# Patient Record
Sex: Female | Born: 1950 | Race: Black or African American | Hispanic: No | Marital: Married | Smoking: Never smoker
Health system: Southern US, Community
[De-identification: ages and names within clinical notes are randomized; demographics above are authoritative.]

## PROBLEM LIST (undated history)

## (undated) DIAGNOSIS — N200 Calculus of kidney: Secondary | ICD-10-CM

## (undated) DIAGNOSIS — Z9889 Other specified postprocedural states: Secondary | ICD-10-CM

## (undated) DIAGNOSIS — R200 Anesthesia of skin: Secondary | ICD-10-CM

## (undated) DIAGNOSIS — R112 Nausea with vomiting, unspecified: Secondary | ICD-10-CM

## (undated) DIAGNOSIS — S82891A Other fracture of right lower leg, initial encounter for closed fracture: Secondary | ICD-10-CM

## (undated) DIAGNOSIS — T4145XA Adverse effect of unspecified anesthetic, initial encounter: Secondary | ICD-10-CM

## (undated) DIAGNOSIS — N289 Disorder of kidney and ureter, unspecified: Secondary | ICD-10-CM

## (undated) DIAGNOSIS — R202 Paresthesia of skin: Secondary | ICD-10-CM

## (undated) DIAGNOSIS — N390 Urinary tract infection, site not specified: Secondary | ICD-10-CM

## (undated) DIAGNOSIS — I10 Essential (primary) hypertension: Secondary | ICD-10-CM

## (undated) DIAGNOSIS — T8859XA Other complications of anesthesia, initial encounter: Secondary | ICD-10-CM

## (undated) HISTORY — PX: LITHOTRIPSY: SUR834

## (undated) HISTORY — PX: ABDOMINAL HYSTERECTOMY: SHX81

## (undated) HISTORY — PX: OTHER SURGICAL HISTORY: SHX169

---

## 1999-01-02 ENCOUNTER — Other Ambulatory Visit: Admission: RE | Admit: 1999-01-02 | Discharge: 1999-01-02 | Payer: Self-pay | Admitting: Obstetrics and Gynecology

## 1999-05-07 ENCOUNTER — Encounter: Payer: Self-pay | Admitting: Emergency Medicine

## 1999-05-07 ENCOUNTER — Inpatient Hospital Stay (HOSPITAL_COMMUNITY): Admission: EM | Admit: 1999-05-07 | Discharge: 1999-05-09 | Payer: Self-pay | Admitting: Emergency Medicine

## 1999-05-08 ENCOUNTER — Encounter: Payer: Self-pay | Admitting: Urology

## 1999-09-27 ENCOUNTER — Encounter: Payer: Self-pay | Admitting: Urology

## 1999-09-27 ENCOUNTER — Encounter: Admission: RE | Admit: 1999-09-27 | Discharge: 1999-09-27 | Payer: Self-pay | Admitting: Urology

## 1999-10-11 ENCOUNTER — Ambulatory Visit (HOSPITAL_COMMUNITY): Admission: RE | Admit: 1999-10-11 | Discharge: 1999-10-11 | Payer: Self-pay | Admitting: Urology

## 1999-10-11 ENCOUNTER — Encounter: Payer: Self-pay | Admitting: Urology

## 1999-11-06 ENCOUNTER — Encounter: Admission: RE | Admit: 1999-11-06 | Discharge: 1999-11-06 | Payer: Self-pay | Admitting: Urology

## 1999-11-06 ENCOUNTER — Encounter: Payer: Self-pay | Admitting: Urology

## 1999-11-12 ENCOUNTER — Ambulatory Visit (HOSPITAL_COMMUNITY): Admission: RE | Admit: 1999-11-12 | Discharge: 1999-11-12 | Payer: Self-pay | Admitting: Urology

## 1999-11-19 ENCOUNTER — Encounter: Payer: Self-pay | Admitting: Urology

## 1999-11-19 ENCOUNTER — Ambulatory Visit (HOSPITAL_COMMUNITY): Admission: RE | Admit: 1999-11-19 | Discharge: 1999-11-19 | Payer: Self-pay | Admitting: Urology

## 1999-11-26 ENCOUNTER — Encounter: Admission: RE | Admit: 1999-11-26 | Discharge: 1999-11-26 | Payer: Self-pay | Admitting: Urology

## 1999-11-26 ENCOUNTER — Encounter: Payer: Self-pay | Admitting: Urology

## 2000-01-01 ENCOUNTER — Encounter: Payer: Self-pay | Admitting: Urology

## 2000-01-01 ENCOUNTER — Encounter: Admission: RE | Admit: 2000-01-01 | Discharge: 2000-01-01 | Payer: Self-pay | Admitting: Urology

## 2000-07-24 ENCOUNTER — Other Ambulatory Visit: Admission: RE | Admit: 2000-07-24 | Discharge: 2000-07-24 | Payer: Self-pay | Admitting: Obstetrics and Gynecology

## 2000-08-07 ENCOUNTER — Encounter: Payer: Self-pay | Admitting: Urology

## 2000-08-07 ENCOUNTER — Encounter: Admission: RE | Admit: 2000-08-07 | Discharge: 2000-08-07 | Payer: Self-pay | Admitting: Urology

## 2001-03-11 ENCOUNTER — Encounter: Payer: Self-pay | Admitting: Urology

## 2001-03-11 ENCOUNTER — Encounter: Admission: RE | Admit: 2001-03-11 | Discharge: 2001-03-11 | Payer: Self-pay | Admitting: Urology

## 2002-12-27 ENCOUNTER — Other Ambulatory Visit: Admission: RE | Admit: 2002-12-27 | Discharge: 2002-12-27 | Payer: Self-pay | Admitting: Obstetrics and Gynecology

## 2003-03-08 ENCOUNTER — Ambulatory Visit (HOSPITAL_COMMUNITY): Admission: RE | Admit: 2003-03-08 | Discharge: 2003-03-08 | Payer: Self-pay | Admitting: Gastroenterology

## 2004-11-19 ENCOUNTER — Emergency Department (HOSPITAL_COMMUNITY): Admission: EM | Admit: 2004-11-19 | Discharge: 2004-11-19 | Payer: Self-pay | Admitting: Emergency Medicine

## 2006-05-18 ENCOUNTER — Emergency Department (HOSPITAL_COMMUNITY): Admission: EM | Admit: 2006-05-18 | Discharge: 2006-05-18 | Payer: Self-pay | Admitting: Emergency Medicine

## 2006-05-22 ENCOUNTER — Ambulatory Visit (HOSPITAL_BASED_OUTPATIENT_CLINIC_OR_DEPARTMENT_OTHER): Admission: RE | Admit: 2006-05-22 | Discharge: 2006-05-22 | Payer: Self-pay | Admitting: Urology

## 2006-08-04 ENCOUNTER — Other Ambulatory Visit: Admission: RE | Admit: 2006-08-04 | Discharge: 2006-08-04 | Payer: Self-pay | Admitting: Obstetrics and Gynecology

## 2006-08-13 ENCOUNTER — Encounter: Admission: RE | Admit: 2006-08-13 | Discharge: 2006-08-13 | Payer: Self-pay | Admitting: Internal Medicine

## 2007-03-09 ENCOUNTER — Encounter: Admission: RE | Admit: 2007-03-09 | Discharge: 2007-03-09 | Payer: Self-pay | Admitting: Internal Medicine

## 2010-02-18 ENCOUNTER — Encounter: Payer: Self-pay | Admitting: Internal Medicine

## 2010-06-15 NOTE — Op Note (Signed)
NAMEMELI, Emily Raymond               ACCOUNT NO.:  [id number]   MEDICAL RECORD NO.:  [id number]          PATIENT TYPE:  AMB   LOCATION:  [facility]                         FACILITY:  [facility]   PHYSICIAN:  Jamison Neighbor, M.D.  DATE OF BIRTH:  [birth year]   DATE OF PROCEDURE:  DATE OF DISCHARGE:                               OPERATIVE REPORT   PREOPERATIVE DIAGNOSES:  1. Left UVJ stone.  2. Left lower pole calculi.   POSTOPERATIVE DIAGNOSES:  1. Passed left ureteral calculus.  2. Distal left ureteral stricture.  3. Left lower pole calculi.   PROCEDURE:  1. Cystoscopy, left retrograde with interpretation.  2. Left ureteral dilation.  3. Left ureteroscopy.  4. Left double J catheter insertion.   SURGEON:  Evans.   ANESTHESIA:  General.   COMPLICATIONS:  None.   DRAINS:  6 French x 24 cm double J catheter.   BRIEF HISTORY:  This 60 year old female has a history of renal and  ureteral calculi.  She has had stone extraction before as well as past  ESWL.  The patient recently developed pain on the left, pain on the  side.  A CT scan showed a stone in the distal left ureter, measuring 4  mm in size.  The patient also had 2 smaller stones in the left kidney  and there was associated hydronephrosis.  The patient was given Flomax  as well as pain medication but she has to pass the stone.  She is now to  undergo cystoscopy retrograde studies, ureteroscopy and possible basket  extraction.  She understands the risks and benefits of the procedure and  gave full informed consent.   PROCEDURE:  After successful induction under general anesthesia the  patient was placed in the dorsal lithotomy position, prepped with  Betadine, draped in the usual sterile fashion.  Careful bimanual  examination revealed a normal pelvis with no significant cystocele,  rectocele or enterocele and no mass on bimanual exam.  The cystoscope  was inserted.  The bladder was carefully inspected.  No tumors or stones  could be seen. The two ureterorrhaphies were normal in configuration and  location.  The left ureteral orifice was cannulated with a 6 French  ureteral cath and a retrograde study was performed.   Retrograde pyelography was performed by injecting 2 of their previously  placed ureteral catheter.  Hydronephrosis was identified but no real  filling defect could be identified in the distal left ureter.  The  kidney filled normally. It was a question of two possible calculi in the  lower pole but there were somewhat indistinct on the retrograde study.  No tumors could be identified and there was no regularity of the  collecting system.   After completion of the retrograde study a guidewire was passed up to  the kidney and allowed to flow normally within the collecting system.  The  cystoscope was removed and the guidewire was left in place. The  ureteroscope was inserted but could not be inserted into the ureter due  to stricturing.  Diuresing re-relaxed.  The sheath was used to dilate  the  stricture.  After completion of the dilation, ureteroscope could be  advanced.  Once the intermural tunnel had been bypassed, the ureter was  wide open. The scope was advanced all the way to the kidney.  The  collecting system in the upper pole could be easily identified but due  to the rigid nature of the scope, the lower pole was not visualized.  It  was not felt that the patient required extra action of those two lower  pole calculi which should pass on their own if they should ever start to  move.  The scope was withdrawn and the entire ureter was identified.  The strictured area was noted to be wide open.  There were no stones  anywhere and really no evidence of calcification in the intramural  tissue.  The stone apparently had passed but left a strictured area that  was causing obstruction and pain.  The wire was left in place.  Ureteroscope was completely withdrawn.  The double J cath was then   backloaded over the wire and allowed to pass up into the kidney.  Position was then set so the that the coil in the bladder was normal.  The wire was removed.  The string was taped to the patient's thigh.   DISCHARGE INSTRUCTIONS:  The patient will be sent home with Macrodantin  1 daily until the stent is removed.  Pyridium plus as needed for any  discomfort.  She still has pain medication.   FOLLOW UP:  She can return to see me in followup in several weeks.           ______________________________  Jamison Neighbor, M.D.  Electronically Signed     RJE/MEDQ  D:  05/22/2006  T:  05/22/2006  Job:  [id number]

## 2010-06-15 NOTE — Op Note (Signed)
[facility]  Patient:    Emily Raymond, Emily Raymond                      MRN: [id number] Proc. Date: 11/12/99 Adm. Date:  [id number] Attending:  Laqueta Jean                           Operative Report  PREOPERATIVE DIAGNOSIS:  Left renal stone.  POSTOPERATIVE DIAGNOSIS:  Left renal stone.  PROCEDURE:  Cystourethroscopy, left retrograde pyelogram, left double J catheter.  SURGEON:  Sigmund I. Patsi Sears, M.D.  ANESTHESIA:  General (LMA).  PREOPERATIVE PREPARATION:  Following appropriate preanesthesia, the patient was brought into the operating room and placed on the operating table in the dorsal supine position, where general anesthesia was introduced (LMA).  She was then replaced in the dorsal lithotomy position, where the pubis was prepped with Betadine solution in the usual fashion.  DESCRIPTION OF PROCEDURE:  Cystourethroscopy was accomplished after reviewing the KUB, which shows that the patient has left renal stone material present. She is having continued left flank pain.  By past history, it is noted that the patient is status post cystoscopy with left double J catheter placed in April 2001 for uroseptic shock secondary to obstructing stone.  She is status post basket extraction of left lower ureteral calculus in April 2001, with a diagnosis of calcium phosphate stone.  Cystoscopy was accomplished today, which showed normal-appearing bladder with a small left ureteral orifice.  Left retrograde pyelogram revealed no evidence of a ureteral calculus.  There was no dilation of the ureter.  The KUB showed the stone in the left lower pole, but the stone could not be easily identified on retrograde.  Double J catheter was placed in the upper pole, and it coiled in the bladder easily.  The patient will be left on antibiotics, and awakened, taken to the recovery room after IV Toradol given. DD:  11/12/99 TD:  11/12/99 Job: [id number]

## 2010-06-15 NOTE — Op Note (Signed)
NAME:  Emily Raymond, BORAWSKI                         ACCOUNT NO.:  [id number]   MEDICAL RECORD NO.:  [id number]                   PATIENT TYPE:  AMB   LOCATION:  ENDO                                 FACILITY:  [facility]   PHYSICIAN:  James L. Malon Kindle., M.D.          DATE OF BIRTH:  Apr 22, 1950   DATE OF PROCEDURE:  03/08/2003  DATE OF DISCHARGE:                                 OPERATIVE REPORT   PROCEDURE:  Colonoscopy.   MEDICATIONS:  Fentanyl 75 mcg, Versed 8 mg IV.   INDICATIONS:  Colon cancer screening.   DESCRIPTION OF PROCEDURE:  The procedure had been explained to the patient  and consent obtained.   With the patient in the left lateral decubitus position, the Olympus  pediatric adjustable coloscope was inserted and advanced under direct  visualization.  A digital exam had been performed.  The prep was excellent.  We advanced easily to the cecum.  The ileocecal valve and appendiceal  orifice were seen.  The  scope was withdrawn and the cecum, ascending colon,  transverse colon, splenic flexure, descending and sigmoid colon were seen  well.  No polyps or other lesions were seen.  No significant diverticular  disease.  The rectum was free of polyps.  The scope was withdrawn.  The  patient tolerated the procedure well.   ASSESSMENT:  Normal screening colonoscopy.   PLAN:  Will recommend yearly hemoccults and consider another exam in 10  years.                                               James L. Malon Kindle., M.D.    Waldron Session  D:  03/08/2003  T:  03/08/2003  Job:  [id number]   cc:   Theressa Millard, M.D.  [address] [place]  [place] [address]  Fax: [phone]

## 2014-07-26 ENCOUNTER — Encounter (HOSPITAL_BASED_OUTPATIENT_CLINIC_OR_DEPARTMENT_OTHER): Payer: Self-pay | Admitting: Emergency Medicine

## 2014-07-26 ENCOUNTER — Emergency Department (HOSPITAL_BASED_OUTPATIENT_CLINIC_OR_DEPARTMENT_OTHER)
Admission: EM | Admit: 2014-07-26 | Discharge: 2014-07-26 | Disposition: A | Discharge status: Home / Self Care | Payer: 59 | Attending: Emergency Medicine | Admitting: Emergency Medicine

## 2014-07-26 ENCOUNTER — Emergency Department (HOSPITAL_BASED_OUTPATIENT_CLINIC_OR_DEPARTMENT_OTHER): Payer: 59

## 2014-07-26 DIAGNOSIS — S82851A Displaced trimalleolar fracture of right lower leg, initial encounter for closed fracture: Secondary | ICD-10-CM | POA: Diagnosis not present

## 2014-07-26 DIAGNOSIS — W108XXA Fall (on) (from) other stairs and steps, initial encounter: Secondary | ICD-10-CM | POA: Diagnosis not present

## 2014-07-26 DIAGNOSIS — Z79899 Other long term (current) drug therapy: Secondary | ICD-10-CM | POA: Insufficient documentation

## 2014-07-26 DIAGNOSIS — Y9289 Other specified places as the place of occurrence of the external cause: Secondary | ICD-10-CM | POA: Insufficient documentation

## 2014-07-26 DIAGNOSIS — Z8742 Personal history of other diseases of the female genital tract: Secondary | ICD-10-CM | POA: Insufficient documentation

## 2014-07-26 DIAGNOSIS — I10 Essential (primary) hypertension: Secondary | ICD-10-CM | POA: Diagnosis not present

## 2014-07-26 DIAGNOSIS — Y9389 Activity, other specified: Secondary | ICD-10-CM | POA: Diagnosis not present

## 2014-07-26 DIAGNOSIS — Y998 Other external cause status: Secondary | ICD-10-CM | POA: Diagnosis not present

## 2014-07-26 DIAGNOSIS — W19XXXA Unspecified fall, initial encounter: Secondary | ICD-10-CM

## 2014-07-26 DIAGNOSIS — S99911A Unspecified injury of right ankle, initial encounter: Secondary | ICD-10-CM | POA: Diagnosis present

## 2014-07-26 HISTORY — DX: Essential (primary) hypertension: I10

## 2014-07-26 HISTORY — DX: Disorder of kidney and ureter, unspecified: N28.9

## 2014-07-26 MED ORDER — OXYCODONE-ACETAMINOPHEN 5-325 MG PO TABS
2.0000 | ORAL_TABLET | Freq: Once | ORAL | Status: AC
  Administered 2014-07-26: 2 via ORAL
  Filled 2014-07-26: qty 2

## 2014-07-26 MED ORDER — OXYCODONE-ACETAMINOPHEN 5-325 MG PO TABS: 1.0000 | ORAL_TABLET | Freq: Three times a day (TID) | ORAL | Status: DC | PRN

## 2014-07-26 NOTE — Discharge Instructions (Signed)
Ankle Fracture  A fracture is a break in a bone. The ankle joint is made up of three bones. These include the lower (distal)sections of your lower leg bones, called the tibia and fibula, along with a bone in your foot, called the talus. Depending on how bad the break is and if more than one ankle joint bone is broken, a cast or splint is used to protect and keep your injured bone from moving while it heals. Sometimes, surgery is required to help the fracture heal properly.   There are two general types of fractures:   Stable fracture. This includes a single fracture line through one bone, with no injury to ankle ligaments. A fracture of the talus that does not have any displacement (movement of the bone on either side of the fracture line) is also stable.   Unstable fracture. This includes more than one fracture line through one or more bones in the ankle joint. It also includes fractures that have displacement of the bone on either side of the fracture line.  CAUSES   A direct blow to the ankle.    Quickly and severely twisting your ankle.   Trauma, such as a car accident or falling from a significant height.  RISK FACTORS  You may be at a higher risk of ankle fracture if:   You have certain medical conditions.   You are involved in high-impact sports.   You are involved in a high-impact car accident.  SIGNS AND SYMPTOMS    Tender and swollen ankle.   Bruising around the injured ankle.   Pain on movement of the ankle.   Difficulty walking or putting weight on the ankle.   A cold foot below the site of the ankle injury. This can occur if the blood vessels passing through your injured ankle were also damaged.   Numbness in the foot below the site of the ankle injury.  DIAGNOSIS   An ankle fracture is usually diagnosed with a physical exam and X-rays. A CT scan may also be required for complex fractures.  TREATMENT   Stable fractures are treated with a cast or splint and using crutches to avoid putting  weight on your injured ankle. This is followed by an ankle strengthening program. Some patients require a special type of cast, depending on other medical problems they may have. Unstable fractures require surgery to ensure the bones heal properly. Your health care provider will tell you what type of fracture you have and the best treatment for your condition.  HOME CARE INSTRUCTIONS    Review correct crutch use with your health care provider and use your crutches as directed. Safe use of crutches is extremely important. Misuse of crutches can cause you to fall or cause injury to nerves in your hands or armpits.   Do not put weight or pressure on the injured ankle until directed by your health care provider.   To lessen the swelling, keep the injured leg elevated while sitting or lying down.   Apply ice to the injured area:   Put ice in a plastic bag.   Place a towel between your cast and the bag.   Leave the ice on for 20 minutes, 2-3 times a day.   If you have a plaster or fiberglass cast:   Do not try to scratch the skin under the cast with any objects. This can increase your risk of skin infection.   Check the skin around the cast every day. You   may put lotion on any red or sore areas.   Keep your cast dry and clean.   If you have a plaster splint:   Wear the splint as directed.   You may loosen the elastic around the splint if your toes become numb, tingle, or turn cold or blue.   Do not put pressure on any part of your cast or splint; it may break. Rest your cast only on a pillow the first 24 hours until it is fully hardened.   Your cast or splint can be protected during bathing with a plastic bag sealed to your skin with medical tape. Do not lower the cast or splint into water.   Take medicines as directed by your health care provider. Only take over-the-counter or prescription medicines for pain, discomfort, or fever as directed by your health care provider.   Do not drive a vehicle until  your health care provider specifically tells you it is safe to do so.   If your health care provider has given you a follow-up appointment, it is very important to keep that appointment. Not keeping the appointment could result in a chronic or permanent injury, pain, and disability. If you have any problem keeping the appointment, call the facility for assistance.  SEEK MEDICAL CARE IF:  You develop increased swelling or discomfort.  SEEK IMMEDIATE MEDICAL CARE IF:    Your cast gets damaged or breaks.   You have continued severe pain.   You develop new pain or swelling after the cast was put on.   Your skin or toenails below the injury turn blue or gray.   Your skin or toenails below the injury feel cold, numb, or have loss of sensitivity to touch.   There is a bad smell or pus draining from under the cast.  MAKE SURE YOU:    Understand these instructions.   Will watch your condition.   Will get help right away if you are not doing well or get worse.  Document Released: 01/12/2000 Document Revised: 01/19/2013 Document Reviewed: 08/13/2012  Exit[employer] Patient Information 2015 [employer]. This information is not intended to replace advice given to you by your health care provider. Make sure you discuss any questions you have with your health care provider.

## 2014-07-26 NOTE — ED Notes (Signed)
64 yo fall down stairs this morning c/o generalized pain but right ankle is swollen.

## 2014-07-26 NOTE — ED Notes (Signed)
[place] this [place] in [place],  C/o left arm pain, generalized pain and rt ankle pain is worse w increased swelling

## 2014-07-26 NOTE — ED Provider Notes (Signed)
CSN: [id number]     Arrival date & time 07/26/14  1952 History   First MD Initiated Contact with Patient 07/26/14 2030     Chief Complaint  Patient presents with  . Fall     (Consider location/radiation/quality/duration/timing/severity/associated sxs/prior Treatment) HPI Comments: Pt comes in with c/o right ankle pain that started this morning after falling down the stairs. No loc or dizziness associated with the fall. She states that she was in [place] but she wanted to get home some she traveled all day today. No previous injury to the area. Doesn't take any blood thinners. No numbness or weakness.   The history is provided by the patient. No language interpreter was used.    Past Medical History  Diagnosis Date  . Hypertension   . Renal disorder    Past Surgical History  Procedure Laterality Date  . Abdominal hysterectomy     No family history on file. History  Substance Use Topics  . Smoking status: Never Smoker   . Smokeless tobacco: Not on file  . Alcohol Use: No   OB History    No data available     Review of Systems  All other systems reviewed and are negative.     Allergies  Review of patient's allergies indicates no known allergies.  Home Medications   Prior to Admission medications   Medication Sig Start Date End Date Taking? Authorizing Provider  AMLODIPINE BESYLATE PO Take by mouth.   Yes Historical Provider, MD   BP 163/90 mmHg  Pulse 80  Temp(Src) 98.3 F (36.8 C)  Resp 18  Ht 5\' 7"  (1.702 m)  Wt 180 lb (81.647 kg)  BMI 28.19 kg/m2  SpO2 98% Physical Exam  Constitutional: She appears well-developed and well-nourished.  Cardiovascular: Normal rate and regular rhythm.   Pulmonary/Chest: Effort normal and breath sounds normal.  Abdominal: Soft. Bowel sounds are normal. There is no rebound.  Musculoskeletal:       Cervical back: Normal.       Thoracic back: Normal.       Lumbar back: Normal.  Swelling noted to the right ankle. Pulses intact.  Non tender upper extremities  Neurological: She is alert. She exhibits normal muscle tone. Coordination normal.  Skin: Skin is warm and dry.  Psychiatric: She has a normal mood and affect.  Nursing note and vitals reviewed.   ED Course  Procedures (including critical care time) Labs Review Labs Reviewed - No data to display  Imaging Review Dg Ankle Complete Right  07/26/2014   CLINICAL DATA:  Right ankle pain after fall. Patient reports fall [place] 8 steps [place] this [place].  EXAM: RIGHT ANKLE - COMPLETE 3+ VIEW  COMPARISON:  None.  FINDINGS: There is an oblique mildly displaced distal fibular fracture proximal to the ankle mortise. Nondisplaced distal tibia fracture likely posterior tibial tubercle. There is a probable small chip/avulsion fracture from the distal medial malleolus. The ankle mortise is preserved. There is diffuse soft tissue edema.  IMPRESSION: Mildly displaced distal fibular fracture. Nondisplaced distal tibia fracture, likely involving the posterior tubercle, and probable acute chip/avulsion fracture from the medial malleolus. Suspect this is a trimalleolar fracture pattern.   Electronically Signed   By: Rubye OaksMelanie  Ehinger M.D.   On: 07/26/2014 20:45   Dg Humerus Left  07/26/2014   CLINICAL DATA:  Left humerus pain secondary to a fall in a train station this morning.  EXAM: LEFT HUMERUS - 2+ VIEW  COMPARISON:  None.  FINDINGS: There is no evidence of fracture or other focal bone lesions. Soft tissues are unremarkable.  IMPRESSION: Normal exam.   Electronically Signed   By: Francene Boyers M.D.   On: 07/26/2014 20:46     EKG Interpretation None      MDM   Final diagnoses:  Trimalleolar fracture, right, closed, initial encounter  Fall, initial encounter    Spoke with Dr. Magnus Ivan and pt is okay to follow up in the next 2 days. Neurovascularly intact. Splinted here and discussed no wt bearing. Given oxycodone for pain    Teressa Lower, NP 07/26/14 2202  Blake Divine, MD 07/26/14 2330

## 2014-08-09 ENCOUNTER — Other Ambulatory Visit: Payer: Self-pay | Admitting: Urology

## 2014-08-09 NOTE — Progress Notes (Signed)
Please release orders in Epic to sign and held surgery 08-17-14 pre op 08-15-14 Thanks

## 2014-08-15 ENCOUNTER — Encounter (HOSPITAL_COMMUNITY): Payer: Self-pay

## 2014-08-15 ENCOUNTER — Encounter (HOSPITAL_COMMUNITY)
Admission: RE | Admit: 2014-08-15 | Discharge: 2014-08-15 | Disposition: A | Discharge status: Home / Self Care | Payer: 59 | Source: Ambulatory Visit | Attending: Urology | Admitting: Urology

## 2014-08-15 DIAGNOSIS — I10 Essential (primary) hypertension: Secondary | ICD-10-CM | POA: Diagnosis not present

## 2014-08-15 DIAGNOSIS — N200 Calculus of kidney: Secondary | ICD-10-CM | POA: Diagnosis not present

## 2014-08-15 DIAGNOSIS — Z7982 Long term (current) use of aspirin: Secondary | ICD-10-CM | POA: Diagnosis not present

## 2014-08-15 DIAGNOSIS — Z87440 Personal history of urinary (tract) infections: Secondary | ICD-10-CM | POA: Diagnosis not present

## 2014-08-15 DIAGNOSIS — Z79899 Other long term (current) drug therapy: Secondary | ICD-10-CM | POA: Diagnosis not present

## 2014-08-15 HISTORY — DX: Nausea with vomiting, unspecified: R11.2

## 2014-08-15 HISTORY — DX: Other fracture of right lower leg, initial encounter for closed fracture: S82.891A

## 2014-08-15 HISTORY — DX: Urinary tract infection, site not specified: N39.0

## 2014-08-15 HISTORY — DX: Other complications of anesthesia, initial encounter: T88.59XA

## 2014-08-15 HISTORY — DX: Paresthesia of skin: R20.2

## 2014-08-15 HISTORY — DX: Other specified postprocedural states: Z98.890

## 2014-08-15 HISTORY — DX: Adverse effect of unspecified anesthetic, initial encounter: T41.45XA

## 2014-08-15 HISTORY — DX: Anesthesia of skin: R20.0

## 2014-08-15 HISTORY — DX: Calculus of kidney: N20.0

## 2014-08-15 LAB — BASIC METABOLIC PANEL
Anion gap: 10 (ref 5–15)
BUN: 14 mg/dL (ref 6–20)
CALCIUM: 9.8 mg/dL (ref 8.9–10.3)
CO2: 28 mmol/L (ref 22–32)
Chloride: 103 mmol/L (ref 101–111)
Creatinine, Ser: 0.79 mg/dL (ref 0.44–1.00)
GFR calc non Af Amer: >60 mL/min (ref >60)
GLUCOSE: 109 mg/dL — AB (ref 65–99)
POTASSIUM: 3.7 mmol/L (ref 3.5–5.1)
SODIUM: 141 mmol/L (ref 135–145)

## 2014-08-15 LAB — CBC
HCT: 44 % (ref 36.0–46.0)
Hemoglobin: 14.5 g/dL (ref 12.0–15.0)
MCH: 30.3 pg (ref 26.0–34.0)
MCHC: 33 g/dL (ref 30.0–36.0)
MCV: 91.9 fL (ref 78.0–100.0)
PLATELETS: 284 10*3/uL (ref 150–400)
RBC: 4.79 MIL/uL (ref 3.87–5.11)
RDW: 12.6 % (ref 11.5–15.5)
WBC: 6.7 10*3/uL (ref 4.0–10.5)

## 2014-08-15 NOTE — Patient Instructions (Signed)
Emily DittoShelby J Raymond  08/15/2014   Your procedure is scheduled on: Wednesday August 17, 2014   [facility]  Entrance [place]  elevators to 3rd floor to  Short Stay Center at 9:30 AM.  Call this number if you have problems the morning of surgery [phone]   Remember: ONLY 1 PERSON MAY GO WITH YOU TO SHORT STAY TO GET  READY MORNING OF YOUR SURGERY.  Do not eat food or drink liquids :After Midnight.     Take these medicines the morning of surgery with A SIP OF WATER: Amlodipine (Norvasc); Aspirin per MD orders                                You may not have any metal on your body including hair pins and              piercings  Do not wear jewelry, make-up, lotions, powders or perfumes, deodorant             Do not wear nail polish.  Do not shave  48 hours prior to surgery.               Do not bring valuables to the hospital. [facility] IS NOT             RESPONSIBLE   FOR VALUABLES.  Contacts, dentures or bridgework may not be worn into surgery.  Leave suitcase in the car. After surgery it may be brought to your room.     _____________________________________________________________________             [facility] Pre[facility] Before surgery, you can play an important role.  Because skin is not sterile, your skin needs to be as free of germs as possible.  You can reduce the number of germs on your skin by washing with CHG (chlorahexidine gluconate) soap before surgery.  CHG is an antiseptic cleaner which kills germs and bonds with the skin to continue killing germs even after washing. Please DO NOT use if you have an allergy to CHG or antibacterial soaps.  If your skin becomes reddened/irritated stop using the CHG and inform your nurse when you arrive at Short Stay. Do not shave (including legs and underarms) for at least 48 hours prior to the first CHG shower.  You may shave your face/neck. Please follow these instructions  carefully:  1.  Shower with CHG Soap the night before surgery and the  morning of Surgery.  2.  If you choose to wash your hair, wash your hair first as usual with your  normal  shampoo.  3.  After you shampoo, rinse your hair and body thoroughly to remove the  shampoo.                           4.  Use CHG as you would any other liquid soap.  You can apply chg directly  to the skin and wash                       Gently with a scrungie or clean washcloth.  5.  Apply the CHG Soap to your body ONLY FROM THE NECK DOWN.   Do not use on face/ open  Wound or open sores. Avoid contact with eyes, ears mouth and genitals (private parts).                       Wash face,  Genitals (private parts) with your normal soap.             6.  Wash thoroughly, paying special attention to the area where your surgery  will be performed.  7.  Thoroughly rinse your body with warm water from the neck down.  8.  DO NOT shower/wash with your normal soap after using and rinsing off  the CHG Soap.                9.  Pat yourself dry with a clean towel.            10.  Wear clean pajamas.            11.  Place clean sheets on your bed the night of your first shower and do not  sleep with pets. Day of Surgery : Do not apply any lotions/deodorants the morning of surgery.  Please wear clean clothes to the hospital/surgery center.  FAILURE TO FOLLOW THESE INSTRUCTIONS MAY RESULT IN THE CANCELLATION OF YOUR SURGERY PATIENT SIGNATURE_________________________________  NURSE SIGNATURE__________________________________  ________________________________________________________________________

## 2014-08-17 ENCOUNTER — Ambulatory Visit (HOSPITAL_COMMUNITY): Payer: 59

## 2014-08-17 ENCOUNTER — Ambulatory Visit (HOSPITAL_COMMUNITY)
Admission: RE | Admit: 2014-08-17 | Discharge: 2014-08-19 | Disposition: A | Discharge status: Home / Self Care | Payer: 59 | Source: Ambulatory Visit | Attending: Urology | Admitting: Urology

## 2014-08-17 ENCOUNTER — Ambulatory Visit (HOSPITAL_COMMUNITY): Payer: 59 | Admitting: Certified Registered Nurse Anesthetist

## 2014-08-17 ENCOUNTER — Encounter (HOSPITAL_COMMUNITY)
Admission: RE | Disposition: A | Discharge status: Home / Self Care | Payer: Self-pay | Source: Ambulatory Visit | Attending: Urology

## 2014-08-17 ENCOUNTER — Encounter (HOSPITAL_COMMUNITY): Payer: Self-pay | Admitting: *Deleted

## 2014-08-17 DIAGNOSIS — N200 Calculus of kidney: Secondary | ICD-10-CM | POA: Diagnosis not present

## 2014-08-17 DIAGNOSIS — Z87440 Personal history of urinary (tract) infections: Secondary | ICD-10-CM | POA: Insufficient documentation

## 2014-08-17 DIAGNOSIS — Z7982 Long term (current) use of aspirin: Secondary | ICD-10-CM | POA: Insufficient documentation

## 2014-08-17 DIAGNOSIS — Z419 Encounter for procedure for purposes other than remedying health state, unspecified: Secondary | ICD-10-CM

## 2014-08-17 DIAGNOSIS — I10 Essential (primary) hypertension: Secondary | ICD-10-CM | POA: Insufficient documentation

## 2014-08-17 DIAGNOSIS — Z79899 Other long term (current) drug therapy: Secondary | ICD-10-CM | POA: Insufficient documentation

## 2014-08-17 HISTORY — PX: NEPHROLITHOTOMY: SHX5134

## 2014-08-17 LAB — CBC
HCT: 39.4 % (ref 36.0–46.0)
Hemoglobin: 12.7 g/dL (ref 12.0–15.0)
MCH: 29.5 pg (ref 26.0–34.0)
MCHC: 32.2 g/dL (ref 30.0–36.0)
MCV: 91.6 fL (ref 78.0–100.0)
Platelets: 211 10*3/uL (ref 150–400)
RBC: 4.3 MIL/uL (ref 3.87–5.11)
RDW: 12.6 % (ref 11.5–15.5)
WBC: 12.7 10*3/uL — AB (ref 4.0–10.5)

## 2014-08-17 LAB — BASIC METABOLIC PANEL
Anion gap: 10 (ref 5–15)
BUN: 14 mg/dL (ref 6–20)
CHLORIDE: 107 mmol/L (ref 101–111)
CO2: 27 mmol/L (ref 22–32)
Calcium: 8.5 mg/dL — ABNORMAL LOW (ref 8.9–10.3)
Creatinine, Ser: 0.96 mg/dL (ref 0.44–1.00)
GFR calc Af Amer: >60 mL/min (ref >60)
GFR calc non Af Amer: >60 mL/min (ref >60)
GLUCOSE: 118 mg/dL — AB (ref 65–99)
Potassium: 3.8 mmol/L (ref 3.5–5.1)
Sodium: 144 mmol/L (ref 135–145)

## 2014-08-17 SURGERY — NEPHROLITHOTOMY PERCUTANEOUS
Anesthesia: General | Laterality: Left

## 2014-08-17 MED ORDER — SCOPOLAMINE 1 MG/3DAYS TD PT72
MEDICATED_PATCH | TRANSDERMAL | Status: DC | PRN
  Administered 2014-08-17: 1 via TRANSDERMAL

## 2014-08-17 MED ORDER — TIZANIDINE HCL 4 MG PO TABS
4.0000 mg | ORAL_TABLET | Freq: Four times a day (QID) | ORAL | Status: DC | PRN
  Filled 2014-08-17: qty 1

## 2014-08-17 MED ORDER — DIPHENHYDRAMINE HCL 50 MG/ML IJ SOLN: 12.5000 mg | Freq: Four times a day (QID) | INTRAMUSCULAR | Status: DC | PRN

## 2014-08-17 MED ORDER — CEFAZOLIN SODIUM 1-5 GM-% IV SOLN
1.0000 g | Freq: Three times a day (TID) | INTRAVENOUS | Status: AC
  Administered 2014-08-17 – 2014-08-18 (×2): 1 g via INTRAVENOUS
  Filled 2014-08-17 (×2): qty 50

## 2014-08-17 MED ORDER — SODIUM CHLORIDE 0.9 % IR SOLN
Status: DC | PRN
  Administered 2014-08-17: 13000 mL

## 2014-08-17 MED ORDER — ONDANSETRON HCL 4 MG/2ML IJ SOLN
INTRAMUSCULAR | Status: DC | PRN
  Administered 2014-08-17 (×2): 2 mg via INTRAVENOUS

## 2014-08-17 MED ORDER — NEOSTIGMINE METHYLSULFATE 10 MG/10ML IV SOLN
INTRAVENOUS | Status: DC | PRN
  Administered 2014-08-17: 5 mg via INTRAVENOUS

## 2014-08-17 MED ORDER — ONDANSETRON HCL 4 MG/2ML IJ SOLN
4.0000 mg | INTRAMUSCULAR | Status: DC | PRN
  Administered 2014-08-17 – 2014-08-18 (×2): 4 mg via INTRAVENOUS
  Filled 2014-08-17 (×2): qty 2

## 2014-08-17 MED ORDER — LIDOCAINE HCL (CARDIAC) 20 MG/ML IV SOLN
INTRAVENOUS | Status: DC | PRN
  Administered 2014-08-17: 75 mg via INTRAVENOUS

## 2014-08-17 MED ORDER — HYDROMORPHONE HCL 1 MG/ML IJ SOLN
0.5000 mg | INTRAMUSCULAR | Status: DC | PRN
  Administered 2014-08-17 – 2014-08-18 (×4): 1 mg via INTRAVENOUS
  Filled 2014-08-17 (×5): qty 1

## 2014-08-17 MED ORDER — DEXAMETHASONE SODIUM PHOSPHATE 10 MG/ML IJ SOLN
INTRAMUSCULAR | Status: DC | PRN
  Administered 2014-08-17: 10 mg via INTRAVENOUS

## 2014-08-17 MED ORDER — FENTANYL CITRATE (PF) 100 MCG/2ML IJ SOLN
INTRAMUSCULAR | Status: AC
  Filled 2014-08-17: qty 2

## 2014-08-17 MED ORDER — ROCURONIUM BROMIDE 100 MG/10ML IV SOLN
INTRAVENOUS | Status: DC | PRN
  Administered 2014-08-17 (×2): 10 mg via INTRAVENOUS
  Administered 2014-08-17: 50 mg via INTRAVENOUS
  Administered 2014-08-17: 10 mg via INTRAVENOUS

## 2014-08-17 MED ORDER — HYDROMORPHONE HCL 1 MG/ML IJ SOLN
0.2500 mg | INTRAMUSCULAR | Status: DC | PRN
  Administered 2014-08-17 (×3): 0.5 mg via INTRAVENOUS

## 2014-08-17 MED ORDER — FENTANYL CITRATE (PF) 250 MCG/5ML IJ SOLN
INTRAMUSCULAR | Status: AC
Start: 2014-08-17 — End: 2014-08-17
  Filled 2014-08-17: qty 5

## 2014-08-17 MED ORDER — GLYCOPYRROLATE 0.2 MG/ML IJ SOLN
INTRAMUSCULAR | Status: DC | PRN
  Administered 2014-08-17: 0.6 mg via INTRAVENOUS

## 2014-08-17 MED ORDER — LACTATED RINGERS IV SOLN
INTRAVENOUS | Status: DC | PRN
  Administered 2014-08-17 (×3): via INTRAVENOUS

## 2014-08-17 MED ORDER — OXYCODONE-ACETAMINOPHEN 5-325 MG PO TABS
1.0000 | ORAL_TABLET | ORAL | Status: DC | PRN
  Administered 2014-08-17 – 2014-08-18 (×3): 2 via ORAL
  Administered 2014-08-18 – 2014-08-19 (×2): 1 via ORAL
  Filled 2014-08-17: qty 1
  Filled 2014-08-17 (×2): qty 2
  Filled 2014-08-17: qty 1
  Filled 2014-08-17: qty 2

## 2014-08-17 MED ORDER — HYOSCYAMINE SULFATE 0.125 MG SL SUBL
0.1250 mg | SUBLINGUAL_TABLET | SUBLINGUAL | Status: DC | PRN
  Filled 2014-08-17: qty 1

## 2014-08-17 MED ORDER — LACTATED RINGERS IV SOLN: INTRAVENOUS | Status: DC

## 2014-08-17 MED ORDER — SODIUM CHLORIDE 0.9 % IV SOLN
INTRAVENOUS | Status: DC
Start: 2014-08-17 — End: 2014-08-19
  Administered 2014-08-17: 1000 mL via INTRAVENOUS
  Administered 2014-08-18 (×3): via INTRAVENOUS

## 2014-08-17 MED ORDER — POLYETHYLENE GLYCOL 3350 17 G PO PACK: 17.0000 g | PACK | Freq: Every day | ORAL | Status: DC | PRN

## 2014-08-17 MED ORDER — FENTANYL CITRATE (PF) 100 MCG/2ML IJ SOLN
INTRAMUSCULAR | Status: DC | PRN
  Administered 2014-08-17 (×2): 50 ug via INTRAVENOUS
  Administered 2014-08-17: 100 ug via INTRAVENOUS
  Administered 2014-08-17: 50 ug via INTRAVENOUS
  Administered 2014-08-17: 100 ug via INTRAVENOUS

## 2014-08-17 MED ORDER — ACETAMINOPHEN 325 MG PO TABS: 650.0000 mg | ORAL_TABLET | ORAL | Status: DC | PRN

## 2014-08-17 MED ORDER — ASPIRIN EC 325 MG PO TBEC
325.0000 mg | DELAYED_RELEASE_TABLET | Freq: Every day | ORAL | Status: DC
Start: 2014-08-18 — End: 2014-08-19
  Administered 2014-08-18: 325 mg via ORAL
  Filled 2014-08-17 (×2): qty 1

## 2014-08-17 MED ORDER — ONDANSETRON HCL 4 MG/2ML IJ SOLN
INTRAMUSCULAR | Status: AC
Start: 2014-08-17 — End: 2014-08-17
  Filled 2014-08-17: qty 2

## 2014-08-17 MED ORDER — ROCURONIUM BROMIDE 100 MG/10ML IV SOLN
INTRAVENOUS | Status: AC
  Filled 2014-08-17: qty 1

## 2014-08-17 MED ORDER — SCOPOLAMINE 1 MG/3DAYS TD PT72
MEDICATED_PATCH | TRANSDERMAL | Status: AC
  Filled 2014-08-17: qty 1

## 2014-08-17 MED ORDER — MAGNESIUM CITRATE PO SOLN: 1.0000 | Freq: Once | ORAL | Status: AC | PRN

## 2014-08-17 MED ORDER — PROPOFOL 10 MG/ML IV BOLUS
INTRAVENOUS | Status: AC
  Filled 2014-08-17: qty 20

## 2014-08-17 MED ORDER — DEXAMETHASONE SODIUM PHOSPHATE 10 MG/ML IJ SOLN
INTRAMUSCULAR | Status: AC
Start: 2014-08-17 — End: 2014-08-17
  Filled 2014-08-17: qty 1

## 2014-08-17 MED ORDER — MIDAZOLAM HCL 2 MG/2ML IJ SOLN
INTRAMUSCULAR | Status: AC
  Filled 2014-08-17: qty 2

## 2014-08-17 MED ORDER — HYDROMORPHONE HCL 1 MG/ML IJ SOLN
INTRAMUSCULAR | Status: AC
  Administered 2014-08-17: 0.5 mg via INTRAVENOUS
  Filled 2014-08-17: qty 1

## 2014-08-17 MED ORDER — SENNOSIDES-DOCUSATE SODIUM 8.6-50 MG PO TABS
2.0000 | ORAL_TABLET | Freq: Every day | ORAL | Status: DC
  Administered 2014-08-17: 2 via ORAL
  Filled 2014-08-17: qty 2

## 2014-08-17 MED ORDER — CEFAZOLIN SODIUM-DEXTROSE 2-3 GM-% IV SOLR
2.0000 g | INTRAVENOUS | Status: AC
  Administered 2014-08-17: 2 g via INTRAVENOUS

## 2014-08-17 MED ORDER — BISACODYL 10 MG RE SUPP: 10.0000 mg | Freq: Every day | RECTAL | Status: DC | PRN

## 2014-08-17 MED ORDER — IOHEXOL 300 MG/ML  SOLN
INTRAMUSCULAR | Status: DC | PRN
  Administered 2014-08-17: 120 mL via URETHRAL

## 2014-08-17 MED ORDER — ZOLPIDEM TARTRATE 5 MG PO TABS: 5.0000 mg | ORAL_TABLET | Freq: Every evening | ORAL | Status: DC | PRN

## 2014-08-17 MED ORDER — SCOPOLAMINE 1 MG/3DAYS TD PT72
1.0000 | MEDICATED_PATCH | TRANSDERMAL | Status: DC
  Filled 2014-08-17: qty 1

## 2014-08-17 MED ORDER — PROPOFOL 10 MG/ML IV BOLUS
INTRAVENOUS | Status: DC | PRN
  Administered 2014-08-17: 200 mg via INTRAVENOUS

## 2014-08-17 MED ORDER — MIDAZOLAM HCL 5 MG/5ML IJ SOLN
INTRAMUSCULAR | Status: DC | PRN
  Administered 2014-08-17 (×2): 1 mg via INTRAVENOUS

## 2014-08-17 MED ORDER — HYDROMORPHONE HCL 1 MG/ML IJ SOLN
INTRAMUSCULAR | Status: AC
  Filled 2014-08-17: qty 1

## 2014-08-17 MED ORDER — CEFAZOLIN SODIUM-DEXTROSE 2-3 GM-% IV SOLR
INTRAVENOUS | Status: AC
Start: 2014-08-17 — End: 2014-08-17
  Filled 2014-08-17: qty 50

## 2014-08-17 MED ORDER — DIPHENHYDRAMINE HCL 12.5 MG/5ML PO ELIX: 12.5000 mg | ORAL_SOLUTION | Freq: Four times a day (QID) | ORAL | Status: DC | PRN

## 2014-08-17 MED ORDER — LIDOCAINE HCL (CARDIAC) 20 MG/ML IV SOLN
INTRAVENOUS | Status: AC
  Filled 2014-08-17: qty 5

## 2014-08-17 SURGICAL SUPPLY — 58 items
APL SKNCLS STERI-STRIP NONHPOA (GAUZE/BANDAGES/DRESSINGS) ×1
BAG URINE DRAINAGE (UROLOGICAL SUPPLIES) ×2 IMPLANT
BASKET STONE NITINOL 3FRX115MB (UROLOGICAL SUPPLIES) ×1 IMPLANT
BASKET ZERO TIP NITINOL 2.4FR (BASKET) IMPLANT
BENZOIN TINCTURE PRP APPL 2/3 (GAUZE/BANDAGES/DRESSINGS) ×4 IMPLANT
BLADE SURG 15 STRL LF DISP TIS (BLADE) ×1 IMPLANT
BLADE SURG 15 STRL SS (BLADE) ×3
BSKT STON RTRVL ZERO TP 2.4FR (BASKET)
CATCHER STONE W/TUBE ADAPTER (UROLOGICAL SUPPLIES) IMPLANT
CATH FOLEY 2W COUNCIL 20FR 5CC (CATHETERS) ×2 IMPLANT
CATH FOLEY 2WAY SLVR  5CC 16FR (CATHETERS)
CATH FOLEY 2WAY SLVR 5CC 16FR (CATHETERS) ×1 IMPLANT
CATH IMAGER II 65CM (CATHETERS) ×3 IMPLANT
CATH INTERMIT  6FR 70CM (CATHETERS) ×3 IMPLANT
CATH ROBINSON RED A/P 20FR (CATHETERS) IMPLANT
CATH X-FORCE N30 NEPHROSTOMY (TUBING) ×3 IMPLANT
COVER SURGICAL LIGHT HANDLE (MISCELLANEOUS) ×1 IMPLANT
DRAPE C-ARM 42X120 X-RAY (DRAPES) ×3 IMPLANT
DRAPE LINGEMAN PERC (DRAPES) ×3 IMPLANT
DRAPE SHEET LG 3/4 BI-LAMINATE (DRAPES) ×3 IMPLANT
DRAPE SURG IRRIG POUCH 19X23 (DRAPES) ×3 IMPLANT
DRSG PAD ABDOMINAL 8X10 ST (GAUZE/BANDAGES/DRESSINGS) ×6 IMPLANT
DRSG TEGADERM 8X12 (GAUZE/BANDAGES/DRESSINGS) ×2 IMPLANT
FIBER LASER FLEXIVA 200 (UROLOGICAL SUPPLIES) IMPLANT
FIBER LASER TRAC TIP (UROLOGICAL SUPPLIES) IMPLANT
GAUZE SPONGE 4X4 12PLY STRL (GAUZE/BANDAGES/DRESSINGS) ×3 IMPLANT
GLOVE BIO SURGEON STRL SZ8 (GLOVE) ×17 IMPLANT
GLOVE BIOGEL PI IND STRL 8 (GLOVE) IMPLANT
GLOVE BIOGEL PI INDICATOR 8 (GLOVE) ×2
GOWN STRL REUS W/TWL LRG LVL3 (GOWN DISPOSABLE) ×6 IMPLANT
GOWN STRL REUS W/TWL XL LVL3 (GOWN DISPOSABLE) ×1 IMPLANT
GUIDEWIRE AMPLAZ .035X145 (WIRE) ×3 IMPLANT
GUIDEWIRE ANG ZIPWIRE 038X150 (WIRE) ×2 IMPLANT
GUIDEWIRE STR DUAL SENSOR (WIRE) ×5 IMPLANT
KIT BASIN OR (CUSTOM PROCEDURE TRAY) ×1 IMPLANT
MANIFOLD NEPTUNE II (INSTRUMENTS) ×3 IMPLANT
NDL TROCAR 18X15 ECHO (NEEDLE) IMPLANT
NDL TROCAR 18X20 (NEEDLE) IMPLANT
NEEDLE TROCAR 18X15 ECHO (NEEDLE) ×3 IMPLANT
NEEDLE TROCAR 18X20 (NEEDLE) IMPLANT
NS IRRIG 1000ML POUR BTL (IV SOLUTION) ×3 IMPLANT
PACK BASIC VI WITH GOWN DISP (CUSTOM PROCEDURE TRAY) ×1 IMPLANT
PROBE LITHOCLAST ULTRA 3.8X403 (UROLOGICAL SUPPLIES) ×2 IMPLANT
PROBE PNEUMATIC 1.0MMX570MM (UROLOGICAL SUPPLIES) ×2 IMPLANT
SET AMPLATZ RENAL DILATOR (MISCELLANEOUS) ×2 IMPLANT
SET IRRIG Y TYPE TUR BLADDER L (SET/KITS/TRAYS/PACK) ×4 IMPLANT
SHEATH PEELAWAY SET 9 (SHEATH) ×2 IMPLANT
SPONGE LAP 4X18 X RAY DECT (DISPOSABLE) ×3 IMPLANT
STENT CONTOUR 6FRX26X.038 (STENTS) ×3 IMPLANT
STONE CATCHER W/TUBE ADAPTER (UROLOGICAL SUPPLIES) ×3 IMPLANT
SUT SILK 2 0 30  PSL (SUTURE) ×2
SUT SILK 2 0 30 PSL (SUTURE) ×1 IMPLANT
SYR 20CC LL (SYRINGE) ×6 IMPLANT
SYRINGE 10CC LL (SYRINGE) ×3 IMPLANT
TOWEL OR 17X26 10 PK STRL BLUE (TOWEL DISPOSABLE) ×3 IMPLANT
TUBING CONNECTING 10 (TUBING) ×4 IMPLANT
TUBING CONNECTING 10' (TUBING) ×2
WATER STERILE IRR 1500ML POUR (IV SOLUTION) ×1 IMPLANT

## 2014-08-17 NOTE — Anesthesia Procedure Notes (Signed)
Procedure Name: Intubation Date/Time: 08/17/2014 11:42 AM Performed by: Edison PaceGRAY, Kayna Suppa E Pre-anesthesia Checklist: Patient identified, Emergency Drugs available, Suction available, Patient being monitored and Timeout performed Patient Re-evaluated:Patient Re-evaluated prior to inductionOxygen Delivery Method: Circle system utilized Preoxygenation: Pre-oxygenation with 100% oxygen Intubation Type: IV induction and Cricoid Pressure applied Ventilation: Mask ventilation without difficulty Laryngoscope Size: Mac and 4 Grade View: Grade II Tube type: Oral Tube size: 7.5 mm Number of attempts: 1 Airway Equipment and Method: Stylet Placement Confirmation: ETT inserted through vocal cords under direct vision,  positive ETCO2 and breath sounds checked- equal and bilateral Secured at: 22 cm Tube secured with: Tape Dental Injury: Teeth and Oropharynx as per pre-operative assessment  Difficulty Due To: Difficulty was unanticipated Comments: Somewhat anterior.   head lift/cricoid pressure, posterior cords visualized.

## 2014-08-17 NOTE — Transfer of Care (Signed)
Immediate Anesthesia Transfer of Care Note  Patient: Emily Raymond  Procedure(s) Performed: Procedure(s): LEFT NEPHROLITHOTOMY PERCUTANEOUS WITH ACCESS (Left)  Patient Location: PACU  Anesthesia Type:General  Level of Consciousness: awake, oriented, patient cooperative, lethargic and responds to stimulation  Airway & Oxygen Therapy: Patient Spontanous Breathing and Patient connected to face mask oxygen  Post-op Assessment: Report given to RN, Post -op Vital signs reviewed and stable and Patient moving all extremities  Post vital signs: Reviewed and stable  Last Vitals:  Filed Vitals:   08/17/14 0858  BP: 152/84  Pulse: 94  Temp: 36.8 C  Resp: 16    Complications: No apparent anesthesia complications

## 2014-08-17 NOTE — Anesthesia Postprocedure Evaluation (Signed)
  Anesthesia Post-op Note  Patient: Emily DittoShelby J Raymond  Procedure(s) Performed: Procedure(s) (LRB): LEFT NEPHROLITHOTOMY PERCUTANEOUS WITH ACCESS (Left)  Patient Location: PACU  Anesthesia Type: General  Level of Consciousness: awake and alert   Airway and Oxygen Therapy: Patient Spontanous Breathing  Post-op Pain: mild  Post-op Assessment: Post-op Vital signs reviewed, Patient's Cardiovascular Status Stable, Respiratory Function Stable, Patent Airway and No signs of Nausea or vomiting  Last Vitals:  Filed Vitals:   08/17/14 1543  BP: 139/71  Pulse: 88  Temp: 36.3 C  Resp: 17    Post-op Vital Signs: stable   Complications: No apparent anesthesia complications

## 2014-08-17 NOTE — Op Note (Signed)
.Preoperative diagnosis:Marland Kitchen Left renal stone  Postoperative diagnosis: Same  Procedure 1.  Left percutaneous nephrostolithotomy for stone greater than 2 cm 2.  Left nephrostogram 3.  Intraoperative fluoroscopy, under 1 hour, with interpretation 4.  Placement of a 6 x 26 double-J ureteral stent. 5.  Percutaneous access into the left renal collecting system 6.  Placement of a 2218 French nephrostomy tube 7.  Dilation of percutaneous tract  Attending: Dr. Ronne BinningMckenzie  Anesthesia: General  Estimated blood loss: 300cc  Antibiotics: Ancef  Drains: 1.  16 French Foley catheter 2.  6 x 26 left double-J ureteral stent 3.  18 French nephrostomy tube  Specimens: Stone for analysis  Findings: Left lower pole stone. Access in lower pole  Indications: Patient is a 64 year old female with a history of large left renal stone.  After discussing treatment options and she decided to proceed with left percutaneous nephrostolithotomy.    Procedure in detail: Prior to procedure consent was obtained.  Patient was brought to the operating room and a timeout was done to ensure correct patient, correct procedure, and correct site.  General anesthesia was administered. The patients genetalia was prepped and draped. A flexible cystoscope was passed into the urethra and then the bladder. No masses or lesions were seen in the bladder. Ureteral orifices were in the normal anatomic location. A sensor wire was passed into the left ureter and up to the renal pelvis. A 6 french ureteral catheter was advanced over the wire and up to the renal pelvis the wire was then removed and so was the cystoscope.  A 16 French Foley catheter was in place and the ureteral catheter was secured with a 0 silk tie.  The patient was then placed in the prone position.   The left flank was then prepped and draped in usual sterile fashion.  Contrast was instilled throught the ureteral catheter and the bullseye technique was used to gain access into  the lower pole. Once we gained access a sensor wire was advanced through the spinal needle and down the ureter to the bladder. We then used an 8 french and 10 french dilater to dilate the tract. We then used a sheath to place a second wire down to the bladder.  We then made an incision at the level of the skin and over the wire we then placed a NephroMax dilator.  We dilated the nephrostomy tract to 30 [place] and held this 18 cm of water for 1 minute.  We then  placed the access sheath over the balloon.  The balloon was then deflated.  We then used a rigid nephroscope to perform nephroscopy.  We encountered a large lower pole stone. Using the LithoClast the stone was fragmented and the fragments were removed with graspers.  The stone fragments were sent for composition analysis.  We then removed the nephroscope and over the wire placed a 6 x 26 double-J ureteral stent.  The wire was then removed and good coil was noted in the renal pelvis under direct vision in the bladder under fluoroscopy.  We then placed a 18 French nephrostomy tube through the sheath into the renal pelvis.  The balloon was inflated with 3 mls of contrast.  We then removed the access sheath in and obtained another nephrostogram.  We noted minimal extravasation of contrast.  We then secured the nephrostomy tube with 0 silks in interrupted fashion.  Dressing was placed over the nephrostomy tube site and this then concluded the procedure was well-tolerated by the  patient.  Complications: None  Condition: Stable, extubated, transferred to PACU  Plan: Patient is to be admitted overnight for observation.  Foley catheter through the morning.  Pt is then to be discharged home and followup in 1 week for nephrostomy tube removal. Stent to be removed 1 week after nephrostomy tube

## 2014-08-17 NOTE — Anesthesia Preprocedure Evaluation (Addendum)
Anesthesia Evaluation  Patient identified by MRN, date of birth, ID band Patient awake    Reviewed: Allergy & Precautions, H&P , NPO status , Patient's Chart, lab work & pertinent test results  History of Anesthesia Complications (+) PONV  Airway Mallampati: II  TM Distance: >3 FB Neck ROM: full    Dental no notable dental hx. (+) Dental Advisory Given, Teeth Intact   Pulmonary neg pulmonary ROS,  breath sounds clear to auscultation  Pulmonary exam normal       Cardiovascular Exercise Tolerance: Good hypertension, Pt. on medications Normal cardiovascular examRhythm:regular Rate:Normal     Neuro/Psych negative neurological ROS  negative psych ROS   GI/Hepatic negative GI ROS, Neg liver ROS,   Endo/Other  negative endocrine ROS  Renal/GU negative Renal ROS  negative genitourinary   Musculoskeletal   Abdominal   Peds  Hematology negative hematology ROS (+)   Anesthesia Other Findings   Reproductive/Obstetrics negative OB ROS                            Anesthesia Physical Anesthesia Plan  ASA: II  Anesthesia Plan: General   Post-op Pain Management:    Induction: Intravenous  Airway Management Planned: Oral ETT  Additional Equipment:   Intra-op Plan:   Post-operative Plan: Extubation in OR  Informed Consent: I have reviewed the patients History and Physical, chart, labs and discussed the procedure including the risks, benefits and alternatives for the proposed anesthesia with the patient or authorized representative who has indicated his/her understanding and acceptance.   Dental Advisory Given  Plan Discussed with: CRNA and Surgeon  Anesthesia Plan Comments:         Anesthesia Quick Evaluation

## 2014-08-17 NOTE — Brief Op Note (Signed)
08/17/2014  2:54 PM  PATIENT:  Emily Raymond  64 y.o. female  PRE-OPERATIVE DIAGNOSIS:  LEFT RENAL STONE  POST-OPERATIVE DIAGNOSIS:  LEFT RENAL STONE  PROCEDURE:  Procedure(s): LEFT NEPHROLITHOTOMY PERCUTANEOUS WITH ACCESS (Left)  SURGEON:  Surgeon(s) and Role:    * Malen GauzePatrick L Gagan Dillion, MD - Primary  PHYSICIAN ASSISTANT:   ASSISTANTS: none   ANESTHESIA:   general  EBL:  Total I/O In: 2000 [I.V.:2000] Out: 825 [Urine:525; Blood:300]  BLOOD ADMINISTERED:none  DRAINS: Urinary Catheter (Foley), nephrostomy tube, 6x26 JJ ureteral stent  LOCAL MEDICATIONS USED:  NONE  SPECIMEN:  Stone for analysis  DISPOSITION OF SPECIMEN:  N/A  COUNTS:  YES  TOURNIQUET:  * No tourniquets in log *  DICTATION: .Note written in EPIC  PLAN OF CARE: Admit for overnight observation  PATIENT DISPOSITION:  PACU - hemodynamically stable.   Delay start of Pharmacological VTE agent (>24hrs) due to surgical blood loss or risk of bleeding: not applicable

## 2014-08-17 NOTE — H&P (Signed)
Urology Admission H&P  Chief Complaint: left flank pain  History of Present Illness: Emily Raymond is a 64yo with a hx of nephrolithiasis who was found to have a 2cm left renal stone on a trauma workup after a fall. She has been having intermittent left flank pain for several months. She denies fevers/chills/sweats.  Past Medical History  Diagnosis Date  . Hypertension   . Renal disorder   . Complication of anesthesia   . PONV (postoperative nausea and vomiting)   . Kidney stone     currently has one in left kidney; has had 4 other stones   . Urinary tract infection     hx of   . Numbness and tingling     left thigh  . Ankle fracture, right     secondary to fall    Past Surgical History  Procedure Laterality Date  . Abdominal hysterectomy    . Lithotripsy      times 3  . Kidney stone retrieval      with stent placement     Home Medications:  Prescriptions prior to admission  Medication Sig Dispense Refill Last Dose  . amLODipine (NORVASC) 5 MG tablet Take 5 mg by mouth every morning.   08/17/2014 at 0615  . aspirin EC 325 MG tablet Take 615 mg by mouth daily.    08/17/2014 at 0615  . Coenzyme Q10 (CO Q 10 PO) Take 1 tablet by mouth daily.   Past Week at Unknown time  . Multiple Vitamin (MULTIVITAMIN WITH MINERALS) TABS tablet Take 1 tablet by mouth daily.   Past Week at Unknown time  . oxyCODONE-acetaminophen (PERCOCET/ROXICET) 5-325 MG per tablet Take 1-2 tablets by mouth every 8 (eight) hours as needed for severe pain. 15 tablet 0 08/15/2014  . tiZANidine (ZANAFLEX) 4 MG tablet Take 4 mg by mouth every 6 (six) hours as needed for muscle spasms.   08/16/2014 at am  . traMADol (ULTRAM) 50 MG tablet Take 50 mg by mouth every 6 (six) hours as needed for moderate pain.   08/16/2014 at am   Allergies: No Known Allergies  History reviewed. No pertinent family history. Social History:  reports that she has never smoked. She has never used smokeless tobacco. She reports that she does not  drink alcohol or use illicit drugs.  Review of Systems  Genitourinary: Positive for flank pain.  All other systems reviewed and are negative.   Physical Exam:  Vital signs in last 24 hours: Temp:  [98.3 F (36.8 C)] 98.3 F (36.8 C) (07/20 0858) Pulse Rate:  [94] 94 (07/20 0858) Resp:  [16] 16 (07/20 0858) BP: (152)/(84) 152/84 mmHg (07/20 0858) SpO2:  [100 %] 100 % (07/20 0858) Weight:  [78.104 kg (172 lb 3 oz)] 78.104 kg (172 lb 3 oz) (07/20 1610) Physical Exam  Constitutional: She is oriented to person, place, and time. She appears well-developed.  HENT:  Head: Normocephalic and atraumatic.  Eyes: EOM are normal. Pupils are equal, round, and reactive to light.  Neck: Normal range of motion. Neck supple. No thyromegaly present.  Cardiovascular: Normal rate and regular rhythm.   Respiratory: Effort normal and breath sounds normal.  GI: Soft. She exhibits no distension. There is no tenderness.  Musculoskeletal: Normal range of motion.  Neurological: She is alert and oriented to person, place, and time.  Skin: Skin is warm and dry.  Psychiatric: She has a normal mood and affect. Her behavior is normal. Judgment and thought content normal.  Laboratory Data:  No results found for this or any previous visit (from the past 24 hour(s)). No results found for this or any previous visit (from the past 240 hour(s)). Creatinine:  Recent Labs  08/15/14 1340  CREATININE 0.79     Impression/Assessment:  L renal stone  Plan:  Risks/benefits/alternatives to left percutaneous nephrostolithotomy was explained to the patient and she understands and wishes to proceed with surgery  Elisia Stepp L 08/17/2014, 11:29 AM

## 2014-08-18 ENCOUNTER — Encounter (HOSPITAL_COMMUNITY): Payer: Self-pay | Admitting: Urology

## 2014-08-18 DIAGNOSIS — N200 Calculus of kidney: Secondary | ICD-10-CM | POA: Diagnosis not present

## 2014-08-18 LAB — CBC
HEMATOCRIT: 35 % — AB (ref 36.0–46.0)
Hemoglobin: 11.3 g/dL — ABNORMAL LOW (ref 12.0–15.0)
MCH: 29.4 pg (ref 26.0–34.0)
MCHC: 32.3 g/dL (ref 30.0–36.0)
MCV: 91.1 fL (ref 78.0–100.0)
Platelets: 161 10*3/uL (ref 150–400)
RBC: 3.84 MIL/uL — AB (ref 3.87–5.11)
RDW: 12.8 % (ref 11.5–15.5)
WBC: 24.6 10*3/uL — AB (ref 4.0–10.5)

## 2014-08-18 LAB — BASIC METABOLIC PANEL
Anion gap: 7 (ref 5–15)
BUN: 13 mg/dL (ref 6–20)
CO2: 26 mmol/L (ref 22–32)
CREATININE: 1.16 mg/dL — AB (ref 0.44–1.00)
Calcium: 8.5 mg/dL — ABNORMAL LOW (ref 8.9–10.3)
Chloride: 108 mmol/L (ref 101–111)
GFR, EST AFRICAN AMERICAN: 57 mL/min — AB (ref >60)
GFR, EST NON AFRICAN AMERICAN: 49 mL/min — AB (ref >60)
GLUCOSE: 136 mg/dL — AB (ref 65–99)
POTASSIUM: 4.3 mmol/L (ref 3.5–5.1)
Sodium: 141 mmol/L (ref 135–145)

## 2014-08-18 MED ORDER — SENNOSIDES-DOCUSATE SODIUM 8.6-50 MG PO TABS: 2.0000 | ORAL_TABLET | Freq: Every day | ORAL | Status: DC | PRN

## 2014-08-18 MED ORDER — PROMETHAZINE HCL 25 MG/ML IJ SOLN
12.5000 mg | Freq: Four times a day (QID) | INTRAMUSCULAR | Status: DC | PRN
  Administered 2014-08-18: 12.5 mg via INTRAVENOUS
  Filled 2014-08-18: qty 1

## 2014-08-19 DIAGNOSIS — N200 Calculus of kidney: Secondary | ICD-10-CM | POA: Diagnosis not present

## 2014-08-19 MED ORDER — OXYCODONE-ACETAMINOPHEN 5-325 MG PO TABS: 1.0000 | ORAL_TABLET | Freq: Three times a day (TID) | ORAL | Status: AC | PRN

## 2014-08-19 MED ORDER — TAMSULOSIN HCL 0.4 MG PO CAPS: 0.4000 mg | ORAL_CAPSULE | Freq: Every day | ORAL | Status: DC

## 2014-08-19 NOTE — Care Management Note (Signed)
Case Management Note  Patient Details  Name: HANIYAH MACIOLEK MRN: [id number] Date of Birth: Mar 09, 1950  Subjective/Objective:                   Action/Plan:d/c home no needs or orders.   Expected Discharge Date:                  Expected Discharge Plan:  Home/Self Care  In-House Referral:     Discharge planning Services  CM Consult  Post Acute Care Choice:    Choice offered to:     DME Arranged:    DME Agency:     HH Arranged:    HH Agency:     Status of Service:  Completed, signed off  Medicare Important Message Given:    Date Medicare IM Given:    Medicare IM give by:    Date Additional Medicare IM Given:    Additional Medicare Important Message give by:     If discussed at Long Length of Stay Meetings, dates discussed:    Additional Comments:  Lanier Clam, RN 08/19/2014, 12:46 PM

## 2014-08-19 NOTE — Progress Notes (Signed)
Patient and sister educated on how to exchange from leg bag to overnight bag for nephrostomy.  Patient's sister did return demonstration and felt comfortable helping patient at home.  Sister also educated on how to change left nephrostomy dressing and frequency.  Dressing was changed prior to discharge.  Patient sent home with dressing supplies and nephrostomy drainage bags.  Patient and sister did not have any further questions at discharge.  Sent home with supplies and prescriptions and told to call MD office today to set up to be seen in 3 days.

## 2014-08-23 NOTE — Discharge Summary (Signed)
Physician Discharge Summary  Patient ID: Emily Raymond MRN: [id number] DOB/AGE: Feb 01, 1950 64 y.o.  Admit date: 08/17/2014 Discharge date: 08/23/2014  Admission Diagnoses: left renal stone  Discharge Diagnoses:  Active Problems:   Nephrolithiasis   Discharged Condition: good  Hospital Course: The patient tolerated the procedure well and was transferred to the floor on IV pain meds, IV fluid. On POD#1 foley was removed, pt was started on clear liquid diet and they ambulated in the halls. On POD#2 the patient was transitioned to a regular diet, IVFs were discontinued, and the patient passed flatus. Prior to discharge the pt was tolerating a regular diet, pain was controlled on PO pain meds, they were ambulating without difficulty, and they had normal bowel function.  Consults: None  Significant Diagnostic Studies: labs: CBC and BMP  Treatments: IV hydration, antibiotics: Ancef, analgesia: Dilaudid and surgery: L PCNL  Discharge Exam: Blood pressure 139/90, pulse 98, temperature 99 F (37.2 C), temperature source Oral, resp. rate 14, height  (1.702 m), weight 78.104 kg (172 lb 3 oz), SpO2 97 %. General appearance: alert, cooperative and appears stated age Head: Normocephalic, without obvious abnormality, atraumatic Eyes: conjunctivae/corneas clear. PERRL, EOM's intact. Fundi benign. Ears: normal TM's and external ear canals both ears Nose: Nares normal. Septum midline. Mucosa normal. No drainage or sinus tenderness. Throat: lips, mucosa, and tongue normal; teeth and gums normal Resp: clear to auscultation bilaterally Cardio: regular rate and rhythm, S1, S2 normal, no murmur, click, rub or gallop GI: soft, non-tender; bowel sounds normal; no masses,  no organomegaly Extremities: extremities normal, atraumatic, no cyanosis or edema Pulses: 2+ and symmetric Lymph nodes: Cervical, supraclavicular, and axillary nodes normal. Neurologic: Alert and oriented X 3, normal strength and  tone. Normal symmetric reflexes. Normal coordination and gait  Disposition: 01-Home or Self Care     Medication List    TAKE these medications        amLODipine 5 MG tablet  Commonly known as:  NORVASC  Take 5 mg by mouth every morning.     aspirin EC 325 MG tablet  Take 325 mg by mouth daily.     CO Q 10 PO  Take 1 tablet by mouth daily.     multivitamin with minerals Tabs tablet  Take 1 tablet by mouth daily.     oxyCODONE-acetaminophen 5-325 MG per tablet  Commonly known as:  PERCOCET/ROXICET  Take 1-2 tablets by mouth every 8 (eight) hours as needed for severe pain.     tamsulosin 0.4 MG Caps capsule  Commonly known as:  FLOMAX  Take 1 capsule (0.4 mg total) by mouth daily after supper.     tiZANidine 4 MG tablet  Commonly known as:  ZANAFLEX  Take 4 mg by mouth every 6 (six) hours as needed for muscle spasms.     traMADol 50 MG tablet  Commonly known as:  ULTRAM  Take 50 mg by mouth every 6 (six) hours as needed for moderate pain.           Follow-up Information    Follow up with MCKENZIE, PATRICK L, MD. Call in 3 days.   Specialty:  Urology   Why:  nephrostomy tube removal   Contact information:   [address] [place] [address] [phone]       Signed: Malen Gauze 08/23/2014, 4:16 PM

## 2014-09-20 ENCOUNTER — Encounter: Payer: Self-pay | Admitting: Neurology

## 2014-09-20 ENCOUNTER — Ambulatory Visit (INDEPENDENT_AMBULATORY_CARE_PROVIDER_SITE_OTHER): Payer: 59 | Admitting: Neurology

## 2014-09-20 VITALS — BP 135/84 | HR 70 | Ht 67.0 in | Wt 173.6 lb

## 2014-09-20 DIAGNOSIS — G5712 Meralgia paresthetica, left lower limb: Secondary | ICD-10-CM | POA: Diagnosis not present

## 2014-09-20 DIAGNOSIS — R200 Anesthesia of skin: Secondary | ICD-10-CM | POA: Diagnosis not present

## 2014-09-20 DIAGNOSIS — S82891A Other fracture of right lower leg, initial encounter for closed fracture: Secondary | ICD-10-CM

## 2014-09-20 DIAGNOSIS — G5710 Meralgia paresthetica, unspecified lower limb: Secondary | ICD-10-CM | POA: Insufficient documentation

## 2014-09-20 NOTE — Patient Instructions (Signed)
-   will do nerve conduction study to rule out nerve compression - depends on the nerve conduction result, will consider MRI if needed. - avoid tight belt if indicated. - may consider topical cream or medication if desired in the future. - follow up in 4-6 weeks.

## 2014-09-20 NOTE — Progress Notes (Signed)
NEUROLOGY CLINIC NEW PATIENT NOTE  NAME: Emily Raymond DOB: August 15, 1950 REFERRING PHYSICIAN: Renford Dills, MD  I saw Emily Raymond as a new consult in the neurovascular clinic today regarding  Chief Complaint  Patient presents with  . Numbness    new pt referral  .  HPI: Emily Raymond is a 64 y.o. female with PMH of kidney stone who presents as a new patient for left thigh numbness.   Patient stated that she suffered from a fall on 07/26/2014. [place] at the time and slipped over a carpet, causing broken right ankle and left flank pain, radiating to left lateral thigh and left lower shoulder. She was even not able to urinate as urination exacerbates the flank pain, and also causing muscle pain around the perineum region. She had right ankle surgery, but due to left flank pain, she received CT of abdomen which showed left kidney stone. The pain was contributed to kidney stone, and she underwent surgery to remove kidney stone on 08/17/2014. However, patient stated that before the surgery her pain was ready getting better, and the left lateral thigh numbness become more prominent. After surgery, her left-sided pain gradually subsided, however the left lateral thigh numbness continues until now. She was referred here for further evaluation.  She denies any weakness of left leg, no numbness beyond left lateral thigh, could not remember whether there is lower back pain at the time after fall, but currently there is no lower back pain. At baseline, she is generally in good condition without significant medical conditions. Currently, she has mild walking difficulty due to right ankle fracture status post surgery.   She denies any cigarette smoking, alcohol drinking, or illicit drug use.  Past Medical History  Diagnosis Date  . Hypertension   . Renal disorder   . Complication of anesthesia   . PONV (postoperative nausea and vomiting)   . Kidney stone     currently has one  in left kidney; has had 4 other stones   . Urinary tract infection     hx of   . Numbness and tingling     left thigh  . Ankle fracture, right     secondary to fall    Past Surgical History  Procedure Laterality Date  . Abdominal hysterectomy    . Lithotripsy      times 3  . Kidney stone retrieval      with stent placement   . Nephrolithotomy Left 08/17/2014    Procedure: LEFT NEPHROLITHOTOMY PERCUTANEOUS WITH ACCESS;  Surgeon: Malen Gauze, MD;  Location: [facility];  Service: Urology;  Laterality: Left;   Family History  Problem Relation Age of Onset  . Dementia Mother   . Stroke Father   . Hypertension Father    Current Outpatient Prescriptions  Medication Sig Dispense Refill  . Biotin 1 MG CAPS Take by mouth.    . Coenzyme Q10 (CO Q 10 PO) Take 1 tablet by mouth daily.    . Multiple Vitamin (MULTIVITAMIN WITH MINERALS) TABS tablet Take 1 tablet by mouth daily.    Marland Kitchen oxyCODONE-acetaminophen (PERCOCET/ROXICET) 5-325 MG per tablet Take 1-2 tablets by mouth every 8 (eight) hours as needed for severe pain. 30 tablet 0  . traMADol (ULTRAM) 50 MG tablet Take 50 mg by mouth every 6 (six) hours as needed for moderate pain.    Marland Kitchen amLODipine (NORVASC) 5 MG tablet Take 5 mg by mouth every morning.    Marland Kitchen  aspirin EC 325 MG tablet Take 325 mg by mouth daily.      No current facility-administered medications for this visit.   No Known Allergies Social History   Social History  . Marital Status: Married    Spouse Name: N/A  . Number of Children: N/A  . Years of Education: N/A   Occupational History  . Not on file.   Social History Main Topics  . Smoking status: Never Smoker   . Smokeless tobacco: Never Used  . Alcohol Use: No  . Drug Use: No  . Sexual Activity: Not on file   Other Topics Concern  . Not on file   Social History Narrative    Review of Systems Full 14 system review of systems performed and notable only for those listed, all others are neg:    Constitutional:   Cardiovascular:  Ear/Nose/Throat:   Skin:  Eyes:   Respiratory:   Gastroitestinal:   Genitourinary:  Hematology/Lymphatic:   Endocrine:  Musculoskeletal:   Allergy/Immunology:   Neurological:  Numbness, dizziness Psychiatric: Decreased energy Sleep: Sleepiness, restless leg   Physical Exam  Filed Vitals:   09/20/14 0930  BP: 135/84  Pulse: 70    General - Well nourished, well developed, in no apparent distress.  Ophthalmologic - Sharp disc margins OU.  Cardiovascular - Regular rate and rhythm with no murmur. Carotid pulses were 2+ without bruits .   Neck - supple, no nuchal rigidity .  Mental Status -  Level of arousal and orientation to time, place, and person were intact. Language including expression, naming, repetition, comprehension, reading, and writing was assessed and found intact. Attention span and concentration were normal. Recent and remote memory were intact. Fund of Knowledge was assessed and was intact.  Cranial Nerves II - XII - II - Visual field intact OU. III, IV, VI - Extraocular movements intact. V - Facial sensation intact bilaterally. VII - Facial movement intact bilaterally. VIII - Hearing & vestibular intact bilaterally. X - Palate elevates symmetrically. XI - Chin turning & shoulder shrug intact bilaterally. XII - Tongue protrusion intact.  Motor Strength - The patient's strength was normal in all extremities and pronator drift was absent.  Bulk was normal and fasciculations were absent.   Motor Tone - Muscle tone was assessed at the neck and appendages and was normal.  Reflexes - The patient's reflexes were 2+ in all extremities except left ankle reflex 1+ and right ankle reflex not able to test due to brace and she had no pathological reflexes.  Sensory - Light touch, temperature/pinprick were decreased at left lateral subcutaneous femoral nerve territory.    Coordination - The patient had normal movements in the  hands and feet with no ataxia or dysmetria.  Tremor was absent.  Gait and Station - mild right antalgic gait due to right ankle fracture status post surgery.   Imaging None  Lab Review None   Assessment and Plan:   In summary, Emily Raymond is a 64 y.o. female with PMH of kidney stone presents with right ankle fracture, left sided flank pain radiating to left lateral thigh and left shoulder after a fall on 07/26/2014, not sure if involving lower back pain. CT abdomen showed left kidney stone, and she and went kidney stone surgery. However, before the surgery her left-sided pain getting better and later resolved, however left lateral thigh numbness remains until now. I suspect the fall caused radiculopathy explaining left sided pain instead of left kidney stone. The left-sided pain  subsided before kidney surgery, and sustained left lateral thigh numbness also support the radiculopathy. 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 No weakness, or decreased patella reflexes. Patient agrees on further testing with EMG, but declined medical treatment at this moment.  - will do nerve conduction study to rule out nerve compression - depends on the nerve conduction result, will consider MRI if needed. - avoid tight belt if indicated. - may consider topical cream or medication if desired in the future. - RTC in 4-6 weeks.  Thank you very much for the opportunity to participate in the care of this patient.  Please do not hesitate to call if any questions or concerns arise.  Orders Placed This Encounter  Procedures  . NCV with EMG(electromyography)    Standing Status: Future     Number of Occurrences:      Standing Expiration Date: 09/20/2015    Meds ordered this encounter  Medications  . Biotin 1 MG CAPS    Sig: Take by mouth.    Patient Instructions  - will do nerve conduction  study to rule out nerve compression - depends on the nerve conduction result, will consider MRI if needed. - avoid tight belt if indicated. - may consider topical cream or medication if desired in the future. - follow up in 4-6 weeks.   Marvel Plan, MD PhD [facility] [address] [place], [place] [address] [phone]

## 2014-09-27 ENCOUNTER — Encounter: Payer: Self-pay | Admitting: Physical Therapy

## 2014-09-27 ENCOUNTER — Ambulatory Visit: Payer: 59 | Attending: Urology | Admitting: Physical Therapy

## 2014-09-27 DIAGNOSIS — W109XXD Fall (on) (from) unspecified stairs and steps, subsequent encounter: Secondary | ICD-10-CM | POA: Insufficient documentation

## 2014-09-27 DIAGNOSIS — S82301D Unspecified fracture of lower end of right tibia, subsequent encounter for closed fracture with routine healing: Secondary | ICD-10-CM | POA: Diagnosis not present

## 2014-09-27 DIAGNOSIS — M25571 Pain in right ankle and joints of right foot: Secondary | ICD-10-CM | POA: Insufficient documentation

## 2014-09-27 DIAGNOSIS — R609 Edema, unspecified: Secondary | ICD-10-CM | POA: Insufficient documentation

## 2014-09-27 DIAGNOSIS — S82401D Unspecified fracture of shaft of right fibula, subsequent encounter for closed fracture with routine healing: Secondary | ICD-10-CM | POA: Insufficient documentation

## 2014-09-27 DIAGNOSIS — R262 Difficulty in walking, not elsewhere classified: Secondary | ICD-10-CM | POA: Diagnosis not present

## 2014-09-27 DIAGNOSIS — R29898 Other symptoms and signs involving the musculoskeletal system: Secondary | ICD-10-CM | POA: Diagnosis not present

## 2014-09-27 DIAGNOSIS — S82891A Other fracture of right lower leg, initial encounter for closed fracture: Secondary | ICD-10-CM

## 2014-09-27 DIAGNOSIS — M25673 Stiffness of unspecified ankle, not elsewhere classified: Secondary | ICD-10-CM

## 2014-09-27 NOTE — Patient Instructions (Signed)
Cryotherapy Cryotherapy means treatment with cold. Ice or gel packs can be used to reduce both pain and swelling. Ice is the most helpful within the first 24 to 48 hours after an injury or flare-up from overusing a muscle or joint. Sprains, strains, spasms, burning pain, shooting pain, and aches can all be eased with ice. Ice can also be used when recovering from surgery. Ice is effective, has very few side effects, and is safe for most people to use. PRECAUTIONS  Ice is not a safe treatment option for people with:  Raynaud phenomenon. This is a condition affecting small blood vessels in the extremities. Exposure to cold may cause your problems to return.  Cold hypersensitivity. There are many forms of cold hypersensitivity, including:  Cold urticaria. Red, itchy hives appear on the skin when the tissues begin to warm after being iced.  Cold erythema. This is a red, itchy rash caused by exposure to cold.  Cold hemoglobinuria. Red blood cells break down when the tissues begin to warm after being iced. The hemoglobin that carry oxygen are passed into the urine because they cannot combine with blood proteins fast enough.  Numbness or altered sensitivity in the area being iced. If you have any of the following conditions, do not use ice until you have discussed cryotherapy with your caregiver:  Heart conditions, such as arrhythmia, angina, or chronic heart disease.  High blood pressure.  Healing wounds or open skin in the area being iced.  Current infections.  Rheumatoid arthritis.  Poor circulation.  Diabetes. Ice slows the blood flow in the region it is applied. This is beneficial when trying to stop inflamed tissues from spreading irritating chemicals to surrounding tissues. However, if you expose your skin to cold temperatures for too long or without the proper protection, you can damage your skin or nerves. Watch for signs of skin damage due to cold. HOME CARE INSTRUCTIONS Follow  these tips to use ice and cold packs safely.  Place a dry or damp towel between the ice and skin. A damp towel will cool the skin more quickly, so you may need to shorten the time that the ice is used.  For a more rapid response, add gentle compression to the ice.  Ice for no more than 10 to 20 minutes at a time. The bonier the area you are icing, the less time it will take to get the benefits of ice.  Check your skin after 5 minutes to make sure there are no signs of a poor response to cold or skin damage.  Rest 20 minutes or more between uses.  Once your skin is numb, you can end your treatment. You can test numbness by very lightly touching your skin. The touch should be so light that you do not see the skin dimple from the pressure of your fingertip. When using ice, most people will feel these normal sensations in this order: cold, burning, aching, and numbness.  Do not use ice on someone who cannot communicate their responses to pain, such as small children or people with dementia. HOW TO MAKE AN ICE PACK Ice packs are the most common way to use ice therapy. Other methods include ice massage, ice baths, and cryosprays. Muscle creams that cause a cold, tingly feeling do not offer the same benefits that ice offers and should not be used as a substitute unless recommended by your caregiver. To make an ice pack, do one of the following:  Place crushed ice or a  bag of frozen vegetables in a sealable plastic bag. Squeeze out the excess air. Place this bag inside another plastic bag. Slide the bag into a pillowcase or place a damp towel between your skin and the bag.  Mix 3 parts water with 1 part rubbing alcohol. Freeze the mixture in a sealable plastic bag. When you remove the mixture from the freezer, it will be slushy. Squeeze out the excess air. Place this bag inside another plastic bag. Slide the bag into a pillowcase or place a damp towel between your skin and the bag. SEEK MEDICAL CARE  IF:  You develop white spots on your skin. This may give the skin a blotchy (mottled) appearance.  Your skin turns blue or pale.  Your skin becomes waxy or hard.  Your swelling gets worse. MAKE SURE YOU:   Understand these instructions.  Will watch your condition.  Will get help right away if you are not doing well or get worse. Document Released: 09/10/2010 Document Revised: 05/31/2013 Document Reviewed: 09/10/2010 [facility] Patient Information 2015 [place], [place]. This information is not intended to replace advice given to you by your health care provider. Make sure you discuss any questions you have with your health care provider.  ROM: Inversion / Eversion   http://orth.exer.us/30   Copyright  VHI. All rights reserved.Gastroc / Heel Cord Stretch - Seated With Towel   Sit on floor, towel around ball of foot. Gently pull foot in toward body, stretching heel cord and calf. Hold for _30-60__ seconds. Repeat on involved leg. Repeat _2-3 times. Do _3__ times per day.  Copyright  VHI. All rights reserved.   Ankle Inversion / Eversion, Sitting   Sit and grasp bottom of one foot. Bend ankle by moving foot inward and outward. Hold each position 30-60__ seconds. Repeat _2-3__ times per session. Do _3__ sessions per day.  Copyright  VHI. All rights reserved.   Ankle Sideward Movement (Inversion / Eversion)   Sitting or lying with feet on floor, keep knee still and rock foot onto outer edge. Return to resting position. Now rock foot onto inner edge. Repeat with other foot, then with both feet. Repeat __10__ times. Do 2-3____ sessions per day.  http://gt2.exer.us/405   Copyright  VHI. All rights reserved.  SEATED Gastroc / Heel Cord Stretch - Seated With Towel    Stand, right foot behind, heel on floor and turned slightly out, leg straight, forward leg bent. Move hips forward. Hold _30__ seconds. Repeat _3__ times per session. Do _3__ sessions per day.  Try to keep  left heel down   Garen Lah, PT 09/27/2014 9:28 AM Phone: [phone] Fax: [phone]

## 2014-09-27 NOTE — Therapy (Signed)
[facility] Outpatient Rehabilitation [facility] [address] [place], [place], [address] Phone: [phone]   Fax:  [phone]  Physical Therapy Evaluation  Patient Details  Name: Emily Raymond MRN: [id number] Date of Birth: March 27, 1950 Referring Provider:  Malen Gauze, MD  Encounter Date: 09/27/2014      PT End of Session - 09/27/14 1121    Visit Number 1   Number of Visits 16   Date for PT Re-Evaluation 11/22/14   Authorization Type UHC   Authorization Time Period 11-22-14   PT Start Time 0846   PT Stop Time 0935   PT Time Calculation (min) 49 min   Activity Tolerance Patient tolerated treatment well   Behavior During Therapy [facility] for tasks assessed/performed      Past Medical History  Diagnosis Date  . Hypertension   . Renal disorder   . Complication of anesthesia   . PONV (postoperative nausea and vomiting)   . Kidney stone     currently has one in left kidney; has had 4 other stones   . Urinary tract infection     hx of   . Numbness and tingling     left thigh  . Ankle fracture, right     secondary to fall     Past Surgical History  Procedure Laterality Date  . Abdominal hysterectomy    . Lithotripsy      times 3  . Kidney stone retrieval      with stent placement   . Nephrolithotomy Left 08/17/2014    Procedure: LEFT NEPHROLITHOTOMY PERCUTANEOUS WITH ACCESS;  Surgeon: Malen Gauze, MD;  Location: [facility];  Service: Urology;  Laterality: Left;    There were no vitals filed for this visit.  Visit Diagnosis:  Ankle fracture, right  Pain in joint, ankle and foot, right  Difficulty walking  Edema  Decreased range of motion of ankle      Subjective Assessment - 09/27/14 0859    Subjective Pt fell on 07-26-14 in [place] while caring for daughter after surgery.  Pt fell down flight of stairs resulting in Mildly displaced  right distal fibular fracture. Nondisplaced right distal tibia.  Pt has crutches and cane at home  but does not use   Pertinent History Pt also had kidney stone surgery Left percutaneous nephrostolithotomy for stone greater than 2 cm 08-17-14 . Pt with fall down flight of stairs  on 07-26-14. Pt also has numbness of left thigh   How long can you sit comfortably? unlimitied   How long can you stand comfortably? 25 min   How long can you walk comfortably? 25 min   Diagnostic tests x ray   Patient Stated Goals Be able to walk normally back to 5 miles a day    Currently in Pain? Yes   Pain Score 3    Pain Location Ankle   Pain Orientation Right   Pain Descriptors / Indicators Nagging;Tightness;Aching   Pain Type Acute pain   Pain Onset More than a month ago   Pain Frequency Intermittent   Aggravating Factors  standing for 25 min or more.  stairs,    Pain Relieving Factors ice and aleve            [facility] PT Assessment - 09/27/14 0855    Assessment   Medical Diagnosis R ankle mildly displaced fibular and nondisplaced tibia /healed   Onset Date/Surgical Date 07/26/14   Hand Dominance Right   Precautions   Precautions Fall  Required Braces or Orthoses Other Brace/Splint   Other Brace/Splint ankle soft ASO brace   Balance Screen   Has the patient fallen in the past 6 months Yes   How many times? 1  down flight of stairs   Has the patient had a decrease in activity level because of a fear of falling?  No   Is the patient reluctant to leave their home because of a fear of falling?  No   Home Environment   Living Environment Private residence   Living Arrangements Spouse/significant other   Type of Home House   Home Access Stairs to enter   Entrance Stairs-Number of Steps 3   Entrance Stairs-Rails None   Home Layout One level   Prior Function   Level of Independence Independent   Cognition   Overall Cognitive Status Within Functional Limits for tasks assessed   Observation/Other Assessments   Lower Extremity Functional Scale  26/80 or 32.5 limitation   Figure 8 Edema    Figure 8 - Right  53 cm   Figure 8 - Left  51 cm   Posture/Postural Control   Posture Comments pes planus R > L   AROM   Right Ankle Dorsiflexion 0   Right Ankle Plantar Flexion 38   Right Ankle Inversion 35   Right Ankle Eversion 12   Left Ankle Dorsiflexion 11   Left Ankle Plantar Flexion 60   Left Ankle Inversion 45   Left Ankle Eversion 20   Strength   Overall Strength Deficits   Overall Strength Comments grossly 4/5 but plantar flex/heel raise on left 25/25, Right 0/25   Palpation   Palpation comment tenderness over lateral malleolus    Ambulation/Gait   Ambulation/Gait Yes   Gait Pattern Decreased step length - left;Decreased stance time - right;Decreased weight shift to right   Gait velocity 2.92 ft/sec                   [facility] Adult PT Treatment/Exercise - 09/27/14 0855    Self-Care   RICE cryotherapy and verbal /handout information   Ankle Exercises: Stretches   Gastroc Stretch 30 seconds;2 reps  left   Gastroc Stretch Limitations discomfort   Ankle Exercises: Standing   Other Standing Ankle Exercises heel raise on left 25/25 right 0/25 attempted 3 times   Ankle Exercises: Seated   Other Seated Ankle Exercises stretch dorsiflexion with a towel for 30 sec times 2   Other Seated Ankle Exercises inversion and eversion exercise for stretching 30 sec each                 PT Education - 09/27/14 1120    Education provided Yes   Education Details Pt explanation of findings and initial HEP for dorsiflexion and Ankle flexibility , education on orthotics for pes planus   Person(s) Educated Patient   Methods Explanation;Demonstration;Verbal cues;Handout   Comprehension Verbalized understanding;Returned demonstration          PT Short Term Goals - 09/27/14 1137    PT SHORT TERM GOAL #1   Title "Demonstrate and verbalize understanding of condition management including RICE, positioning, use of A.D., HEP.    Time 4   Period Weeks   Status New   PT  SHORT TERM GOAL #2   Title Pt will be independent with initial HEP   Time 4   Period Weeks   Status New   PT SHORT TERM GOAL #3   Title Pt will be able to reduce  pain after 25 minutes of standing to 1/10   Time 4   Period Weeks   Status New           PT Long Term Goals - 09/27/14 0102    PT LONG TERM GOAL #1   Title "Pt will be independent with advanced HEP.    Time 8   Period Weeks   Status New   PT LONG TERM GOAL #2   Title "Pain will decrease to 0/10 with all functional activities   Time 8   Period Weeks   Status New   PT LONG TERM GOAL #3   Title Pt will be able to negotiate steps without exacerbating pain.   Time 8   Period Weeks   Status New   PT LONG TERM GOAL #4   Title "Pt will tolerate standing and walking for one hour  without increased pain in order to return to PLOF and walking program    PT LONG TERM GOAL #5   Title LEFS witll improve from 26/80 to at least 16/80   Time 8   Period Weeks   Status New               Plan - 09/27/14 0901    Clinical Impression Statement  Pt presents with limp and wearing soft brace  and stiff right ankle Mildly displaced distal fibular fracture. Nondisplaced distal tibia.  Pt also had kidney stone surgery Left percutaneous nephrostolithotomy for stone greater than 2 cm 08-17-14 . Pt with fall down flight of stairs  on 07-26-14.  Pt has  antalgic gait,edema of Right ankle. limted AROM in left ankle and pain 3/10.  Pt is unable to stand greater than 25 min and pt used to walk 5 miles a day. Pt with difficulty negotiating steps  Pt would benefit from skilled PT to maximze function of ankle and to return to PLOF   Pt will benefit from skilled therapeutic intervention in order to improve on the following deficits Abnormal gait;Decreased activity tolerance;Decreased mobility;Decreased range of motion;Hypomobility;Increased edema;Pain;Impaired flexibility;Increased fascial restricitons;Difficulty walking   Rehab Potential  Excellent   PT Frequency 2x / week   PT Duration 8 weeks   PT Treatment/Interventions ADLs/Self Care Home Management;Cryotherapy;Electrical Stimulation;Iontophoresis 4mg /ml Dexamethasone;Moist Heat;Ultrasound;Gait training;Stair training;Functional mobility training;Therapeutic exercise;Patient/family education;Neuromuscular re-education;Balance training;Passive range of motion;Dry needling;Taping;Vasopneumatic Device   PT Next Visit Plan Progress AROM and ankle strengthening including heel raisies and steps as able  Modalities as needed   PT Home Exercise Plan Dorsiflexion stretch, EV/IN and PF and gastroc stretch standing         Problem List Patient Active Problem List   Diagnosis Date Noted  . Numbness 09/20/2014  . Meralgia paraesthetica 09/20/2014  . Ankle fracture, right 09/20/2014  . Nephrolithiasis 08/17/2014   Garen Lah, PT 09/27/2014 11:46 AM Phone: [phone] Fax: [phone]  By signing I understand that I am ordering/authorizing the use of Iontophoresis using 4 mg/mL of dexamethasone as a component of this plan of care.  [facility] Outpatient Rehabilitation [facility] [address] [place], [place], [address] Phone: [phone]   Fax:  [phone]

## 2014-10-05 ENCOUNTER — Ambulatory Visit: Payer: 59 | Attending: Urology | Admitting: Physical Therapy

## 2014-10-05 DIAGNOSIS — R609 Edema, unspecified: Secondary | ICD-10-CM | POA: Diagnosis present

## 2014-10-05 DIAGNOSIS — S82891A Other fracture of right lower leg, initial encounter for closed fracture: Secondary | ICD-10-CM | POA: Insufficient documentation

## 2014-10-05 DIAGNOSIS — R29898 Other symptoms and signs involving the musculoskeletal system: Secondary | ICD-10-CM | POA: Insufficient documentation

## 2014-10-05 DIAGNOSIS — M25571 Pain in right ankle and joints of right foot: Secondary | ICD-10-CM | POA: Diagnosis present

## 2014-10-05 DIAGNOSIS — M25673 Stiffness of unspecified ankle, not elsewhere classified: Secondary | ICD-10-CM

## 2014-10-05 DIAGNOSIS — R262 Difficulty in walking, not elsewhere classified: Secondary | ICD-10-CM

## 2014-10-05 NOTE — Patient Instructions (Signed)
Inversion: Resisted   Cross legs with right leg underneath, foot in tubing loop. Hold tubing around other foot to resist and turn foot in. Repeat __10__ times per set. Do _2___ sets per session. Do __2__ sessions per day.  http://orth.exer.us/12   Copyright  VHI. All rights reserved.  Eversion: Resisted   With right foot in tubing loop, hold tubing around other foot to resist and turn foot out. Repeat ___10_ times per set. Do ___2_ sets per session. Do ____ sessions per day.  http://orth.exer.us/14   Copyright  VHI. All rights reserved.  Plantar Flexion: Resisted   Anchor behind, tubing around left foot, press down. Repeat _10___ times per set. Do _2___ sets per session. Do 2____ sessions per day.  http://orth.exer.us/10   Copyright  VHI. All rights reserved.  Dorsiflexion: Resisted   Facing anchor, tubing around left foot, pull toward face.  Repeat __10__ times per set. Do __2__ sets per session. Do __2__ sessions per day.  http://orth.exer.us/8   Copyright  VHI. All rights reserved.   Stretching: Soleus   Stand with right foot back, both knees bent. Keeping heel on floor, turned slightly out, lean into wall until stretch is felt in lower calf. Hold __30__ seconds. Repeat __3__ times per set. Do __1_ sets per session. Do _2___ sessions per day.  http://orth.exer.us/665   Copyright  VHI. All rights reserved.  Toe / Heel Raise   Gently rock back on heels and raise toes. Then rock forward on toes and raise heels. Repeat sequence __10__ times per session. Do __2__ sessions per day  Copyright  VHI. All rights reserved.  IONTOPHORESIS PATIENT PRECAUTIONS & CONTRAINDICATIONS:  . Redness under one or both electrodes can occur.  This characterized by a uniform redness that usually disappears within 12 hours of treatment. . Small pinhead size blisters may result in response to the drug.  Contact your physician if the problem persists more than 24 hours. . On rare  occasions, iontophoresis therapy can result in temporary skin reactions such as rash, inflammation, irritation or burns.  The skin reactions may be the result of individual sensitivity to the ionic solution used, the condition of the skin at the start of treatment, reaction to the materials in the electrodes, allergies or sensitivity to dexamethasone, or a poor connection between the patch and your skin.  Discontinue using iontophoresis if you have any of these reactions and report to your therapist. . Remove the Patch or electrodes if you have any undue sensation of pain or burning during the treatment and report discomfort to your therapist. . Tell your Therapist if you have had known adverse reactions to the application of electrical current. . If using the Patch, the LED light will turn off when treatment is complete and the patch can be removed.  Approximate treatment time is 1-3 hours.  Remove the patch when light goes off or after 6 hours. . The Patch can be worn during normal activity, however excessive motion where the electrodes have been placed can cause poor contact between the skin and the electrode or uneven electrical current resulting in greater risk of skin irritation. Marland Kitchen Keep out of the reach of children.   . DO NOT use if you have a cardiac pacemaker or any other electrically sensitive implanted device. . DO NOT use if you have a known sensitivity to dexamethasone. . DO NOT use during Magnetic Resonance Imaging (MRI). . DO NOT use over broken or compromised skin (e.g. sunburn, cuts, or acne) due to  the increased risk of skin reaction. . DO NOT SHAVE over the area to be treated:  To establish good contact between the Patch and the skin, excessive hair may be clipped. . DO NOT place the Patch or electrodes on or over your eyes, directly over your heart, or brain. . DO NOT reuse the Patch or electrodes as this may cause burns to occur.

## 2014-10-05 NOTE — Therapy (Signed)
[facility] Outpatient Rehabilitation [facility] [address] [place], [place], [address] Phone: [phone]   Fax:  [phone]  Physical Therapy Treatment  Patient Details  Name: Emily Raymond MRN: [id number] Date of Birth: 1950-10-04 Referring Provider:  Malen Gauze, MD  Encounter Date: 10/05/2014      PT End of Session - 10/05/14 0936    Visit Number 2   Number of Visits 16   Date for PT Re-Evaluation 11/22/14   Authorization Type UHC   Authorization Time Period 11-22-14   PT Start Time 0845   PT Stop Time 0930   PT Time Calculation (min) 45 min      Past Medical History  Diagnosis Date  . Hypertension   . Renal disorder   . Complication of anesthesia   . PONV (postoperative nausea and vomiting)   . Kidney stone     currently has one in left kidney; has had 4 other stones   . Urinary tract infection     hx of   . Numbness and tingling     left thigh  . Ankle fracture, right     secondary to fall     Past Surgical History  Procedure Laterality Date  . Abdominal hysterectomy    . Lithotripsy      times 3  . Kidney stone retrieval      with stent placement   . Nephrolithotomy Left 08/17/2014    Procedure: LEFT NEPHROLITHOTOMY PERCUTANEOUS WITH ACCESS;  Surgeon: Malen Gauze, MD;  Location: [facility];  Service: Urology;  Laterality: Left;    There were no vitals filed for this visit.  Visit Diagnosis:  Ankle fracture, right  Pain in joint, ankle and foot, right  Difficulty walking  Edema  Decreased range of motion of ankle      Subjective Assessment - 10/05/14 0853    Subjective Its always stiff in the morning.    Currently in Pain? Yes   Pain Score 2    Pain Location Ankle   Pain Orientation Right   Pain Descriptors / Indicators --  stiff   Pain Type Acute pain   Aggravating Factors  certain shoes   Pain Relieving Factors ice and aleve                         [facility] Adult PT Treatment/Exercise -  10/05/14 0909    Modalities   Modalities Iontophoresis   Iontophoresis   Type of Iontophoresis Dexamethasone   Location right anteriolateral ankle   Dose 1 ml  stat patch   Time 6 hours   Ankle Exercises: Seated   Other Seated Ankle Exercises seated self inversion and eversion stretch   Other Seated Ankle Exercises inversion and eversion exercise for stretching 30 sec each    Ankle Exercises: Stretches   Soleus Stretch 3 reps;30 seconds   Gastroc Stretch 3 reps;30 seconds   Ankle Exercises: Standing   Heel Raises 15 reps;5 seconds  bilateral   Ankle Exercises: Supine   T-Band 4 way red band seated x 10 each with 5 sec hold                PT Education - 10/05/14 0932    Education provided Yes   Education Details Red band ankle HEP, soleus stretch, toe/heel raises, IONTO contraindications /precautions   Person(s) Educated Patient   Methods Explanation;Handout   Comprehension Verbalized understanding  PT Short Term Goals - 09/27/14 1137    PT SHORT TERM GOAL #1   Title "Demonstrate and verbalize understanding of condition management including RICE, positioning, use of A.D., HEP.    Time 4   Period Weeks   Status New   PT SHORT TERM GOAL #2   Title Pt will be independent with initial HEP   Time 4   Period Weeks   Status New   PT SHORT TERM GOAL #3   Title Pt will be able to reduce pain after 25 minutes of standing to 1/10   Time 4   Period Weeks   Status New           PT Long Term Goals - 09/27/14 1610    PT LONG TERM GOAL #1   Title "Pt will be independent with advanced HEP.    Time 8   Period Weeks   Status New   PT LONG TERM GOAL #2   Title "Pain will decrease to 0/10 with all functional activities   Time 8   Period Weeks   Status New   PT LONG TERM GOAL #3   Title Pt will be able to negotiate steps without exacerbating pain.   Time 8   Period Weeks   Status New   PT LONG TERM GOAL #4   Title "Pt will tolerate standing and  walking for one hour  without increased pain in order to return to PLOF and walking program    PT LONG TERM GOAL #5   Title LEFS witll improve from 26/80 to at least 16/80   Time 8   Period Weeks   Status New               Plan - 10/05/14 9604    Clinical Impression Statement Reviewed pts HEP and corrected form with standing gastroc stretch, added soleus stretch as well as biateral heel raises/toe raises-pt had been attempting single heel raises at home which caused increased pain. Instructed pt to begin with bilateral heel raises. Trial of Iontophoresis patch today placed on right anteriolateral ankle. Pt verbalized understanding of precautions and wear time of 6 hours.    PT Next Visit Plan Review new HEP. Cont ionto, measure ROM        Problem List Patient Active Problem List   Diagnosis Date Noted  . Numbness 09/20/2014  . Meralgia paraesthetica 09/20/2014  . Ankle fracture, right 09/20/2014  . Nephrolithiasis 08/17/2014    Sherrie Mustache, PTA 10/05/2014, 9:40 AM  [facility] [address] [place], [place], [address] Phone: [phone]   Fax:  [phone]

## 2014-10-10 ENCOUNTER — Ambulatory Visit: Payer: 59 | Admitting: Physical Therapy

## 2014-10-10 DIAGNOSIS — S82891A Other fracture of right lower leg, initial encounter for closed fracture: Secondary | ICD-10-CM | POA: Diagnosis not present

## 2014-10-10 DIAGNOSIS — R262 Difficulty in walking, not elsewhere classified: Secondary | ICD-10-CM

## 2014-10-10 DIAGNOSIS — R609 Edema, unspecified: Secondary | ICD-10-CM

## 2014-10-10 DIAGNOSIS — M25673 Stiffness of unspecified ankle, not elsewhere classified: Secondary | ICD-10-CM

## 2014-10-10 DIAGNOSIS — M25571 Pain in right ankle and joints of right foot: Secondary | ICD-10-CM

## 2014-10-10 NOTE — Therapy (Signed)
[place], [place], [address] Phone: [phone]   Fax:  [phone]  Physical Therapy Treatment  Patient Details  Name: Emily Raymond MRN: [id number] Date of Birth: [birth year] Referring Provider:  Seward Carol, MD  Encounter Date: 10/10/2014      PT End of Session - 10/10/14 1012    Visit Number 4   Number of Visits 16   Date for PT Re-Evaluation 11/22/14   Authorization Type UHC   Authorization Time Period 11-22-14   PT Start Time 0930   PT Stop Time 1012   PT Time Calculation (min) 42 min      Past Medical History  Diagnosis Date  . Hypertension   . Renal disorder   . Complication of anesthesia   . PONV (postoperative nausea and vomiting)   . Kidney stone     currently has one in left kidney; has had 4 other stones   . Urinary tract infection     hx of   . Numbness and tingling     left thigh  . Ankle fracture, right     secondary to fall     Past Surgical History  Procedure Laterality Date  . Abdominal hysterectomy    . Lithotripsy      times 3  . Kidney stone retrieval      with stent placement   . Nephrolithotomy Left 08/17/2014    Procedure: LEFT NEPHROLITHOTOMY PERCUTANEOUS WITH ACCESS;  Surgeon: Cleon Gustin, MD;  Location: [facility];  Service: Urology;  Laterality: Left;    There were no vitals filed for this visit.  Visit Diagnosis:  Ankle fracture, right  Pain in joint, ankle and foot, right  Difficulty walking  Decreased range of motion of ankle  Edema      Subjective Assessment - 10/10/14 1015    Currently in Pain? No/denies            [facility] PT Assessment - 10/10/14 0940    AROM   Right Ankle Dorsiflexion 6   Right Ankle Plantar Flexion 55   Right Ankle Inversion 38   Right Ankle Eversion 20   Left Ankle Dorsiflexion 12   Left Ankle Plantar Flexion 65   Left Ankle Inversion 45   Left Ankle Eversion 20                      [facility] Adult PT Treatment/Exercise - 10/10/14 0001    Ambulation/Gait   Ambulation/Gait Yes   Ambulation/Gait Assistance 7: Independent   Ambulation Distance (Feet) 120 Feet   Assistive device None   Gait Pattern Decreased dorsiflexion - right   Ambulation Surface Level;Indoor   Gait Comments focus on heel strike, toe off, decreaseing antalgic gait.    Ankle Exercises: Supine   T-Band 4 way red band seated x 15 each with 5 sec hold                  PT Short Term Goals - 10/10/14 0959    PT SHORT TERM GOAL #1   Title "Demonstrate and verbalize understanding of condition management including RICE, positioning, use of A.D., HEP.    Time 4   Period Weeks   Status Achieved   PT SHORT TERM GOAL #2   Title Pt will be independent with initial HEP   Time 4   Period Weeks   Status Achieved   PT SHORT TERM GOAL #3   Title Pt will  be able to reduce pain after 25 minutes of standing to 1/10   Time 4   Period Weeks   Status On-going           PT Long Term Goals - 09/27/14 3559    PT LONG TERM GOAL #1   Title "Pt will be independent with advanced HEP.    Time 8   Period Weeks   Status New   PT LONG TERM GOAL #2   Title "Pain will decrease to 0/10 with all functional activities   Time 8   Period Weeks   Status New   PT LONG TERM GOAL #3   Title Pt will be able to negotiate steps without exacerbating pain.   Time 8   Period Weeks   Status New   PT LONG TERM GOAL #4   Title "Pt will tolerate standing and walking for one hour  without increased pain in order to return to PLOF and walking program    PT LONG TERM GOAL #5   Title LEFS witll improve from 26/80 to at least 16/80   Time 8   Period Weeks   Status New               Plan - 10/10/14 0939    Clinical Impression Statement Pt reports moderate increased pain and stiffness at night when she gets up to use the bathroom and first thing in the morning. Gait training focusing on heel strike and toe off in  patient's improved ROM. Pt reports this gait feels "good." Independent with HEP. STG#1,2, MET. AROM all planes improved.    PT Next Visit Plan cont ionto, begin seated BAPS, continue  gait, bike, SLS        Problem List Patient Active Problem List   Diagnosis Date Noted  . Numbness 09/20/2014  . Meralgia paraesthetica 09/20/2014  . Ankle fracture, right 09/20/2014  . Nephrolithiasis 08/17/2014    Dorene Ar, PTA 10/10/2014, 10:17 AM  [facility] [address] [place], [place], [address] Phone: [phone]   Fax:  [phone]

## 2014-10-12 ENCOUNTER — Ambulatory Visit: Payer: 59 | Admitting: Physical Therapy

## 2014-10-17 ENCOUNTER — Ambulatory Visit: Payer: 59 | Admitting: Physical Therapy

## 2014-10-17 DIAGNOSIS — R262 Difficulty in walking, not elsewhere classified: Secondary | ICD-10-CM

## 2014-10-17 DIAGNOSIS — M25673 Stiffness of unspecified ankle, not elsewhere classified: Secondary | ICD-10-CM

## 2014-10-17 DIAGNOSIS — R609 Edema, unspecified: Secondary | ICD-10-CM

## 2014-10-17 DIAGNOSIS — S82891A Other fracture of right lower leg, initial encounter for closed fracture: Secondary | ICD-10-CM | POA: Diagnosis not present

## 2014-10-17 DIAGNOSIS — M25571 Pain in right ankle and joints of right foot: Secondary | ICD-10-CM

## 2014-10-17 NOTE — Therapy (Signed)
[facility] Outpatient Rehabilitation [facility] [address] [place], [place], [address] Phone: [phone]   Fax:  [phone]  Physical Therapy Treatment  Patient Details  Name: Emily Raymond MRN: [id number] Date of Birth: [birth year] Referring Provider:  Renford Dills, MD  Encounter Date: 10/17/2014      PT End of Session - 10/17/14 0937    Visit Number 5   Number of Visits 16   Date for PT Re-Evaluation 11/22/14   Authorization Type UHC   Authorization Time Period 11-22-14   PT Start Time 0932   PT Stop Time 1015   PT Time Calculation (min) 43 min      Past Medical History  Diagnosis Date  . Hypertension   . Renal disorder   . Complication of anesthesia   . PONV (postoperative nausea and vomiting)   . Kidney stone     currently has one in left kidney; has had 4 other stones   . Urinary tract infection     hx of   . Numbness and tingling     left thigh  . Ankle fracture, right     secondary to fall     Past Surgical History  Procedure Laterality Date  . Abdominal hysterectomy    . Lithotripsy      times 3  . Kidney stone retrieval      with stent placement   . Nephrolithotomy Left 08/17/2014    Procedure: LEFT NEPHROLITHOTOMY PERCUTANEOUS WITH ACCESS;  Surgeon: Malen Gauze, MD;  Location: [facility];  Service: Urology;  Laterality: Left;    There were no vitals filed for this visit.  Visit Diagnosis:  Ankle fracture, right  Pain in joint, ankle and foot, right  Difficulty walking  Decreased range of motion of ankle  Edema      Subjective Assessment - 10/17/14 0936    Subjective just morning stiffness   Currently in Pain? No/denies            [facility] PT Assessment - 10/17/14 1004    ROM / Strength   AROM / PROM / Strength Strength   AROM   Right Ankle Dorsiflexion 7   Right Ankle Plantar Flexion 50   Right Ankle Inversion 38   Right Ankle Eversion 22   Strength   Right/Left Ankle Right   Right Ankle Dorsiflexion  5/5   Right Ankle Plantar Flexion 4/5   Right Ankle Inversion 5/5   Right Ankle Eversion 5/5                     [facility] Adult PT Treatment/Exercise - 10/17/14 0958    Ambulation/Gait   Stairs Yes   Stairs Assistance 6: Modified independent (Device/Increase time)   Stair Management Technique Alternating pattern   Number of Stairs 12   Height of Stairs 6   Gait Comments Pt able to negotiate recirprocal stairs with 1 HR and no c/o pain ascending and descending   Modalities   Modalities Iontophoresis   Iontophoresis   Type of Iontophoresis Dexamethasone   Location right medial ankle   Dose 1 ml  stat patch   Time 6 hours   Ankle Exercises: Aerobic   Stationary Bike L1 x 5 min   Ankle Exercises: Standing   SLS 1 min   Rebounder red ball 4 toss best without LOB   Heel Raises 10 reps  single and box raises   Toe Raise 10 reps   Other Standing Ankle Exercises step ups  6 in x 10, retro step ups on right x 10 4 inch    Other Standing Ankle Exercises SLS with vectors and 2 finger hold to assist with keeping weight shifted to RLE x 10 each way in parallel bars   Ankle Exercises: Seated   BAPS Sitting;Level 3  circles each wayx10 DF/PF x10 INV/ev x10                  PT Short Term Goals - 10/17/14 4098    PT SHORT TERM GOAL #1   Title "Demonstrate and verbalize understanding of condition management including RICE, positioning, use of A.D., HEP.    Time 4   Period Weeks   Status Achieved   PT SHORT TERM GOAL #2   Title Pt will be independent with initial HEP   Time 4   Period Weeks   Status Achieved   PT SHORT TERM GOAL #3   Title Pt will be able to reduce pain after 25 minutes of standing to 1/10   Time 4   Period Weeks   Status On-going  4/10           PT Long Term Goals - 10/17/14 1191    PT LONG TERM GOAL #1   Title "Pt will be independent with advanced HEP.    Time 8   Period Weeks   Status On-going   PT LONG TERM GOAL #2   Title "Pain  will decrease to 0/10 with all functional activities   Time 8   Period Weeks   Status On-going  up to 4/10   PT LONG TERM GOAL #3   Title Pt will be able to negotiate steps without exacerbating pain.   Time 8   Period Weeks   Status Achieved   PT LONG TERM GOAL #4   Title "Pt will tolerate standing and walking for one hour  without increased pain in order to return to PLOF and walking program    Time 8   Period Weeks   Status On-going  4/10 currently   PT LONG TERM GOAL #5   Title LEFS witll improve from 26/80 to at least 16/80   Time 8   Period Weeks   Status On-going               Plan - 10/17/14 1156    Clinical Impression Statement Pt reports family trip this weekend with a lot of walking. Pain up to 4/10 each day with standing or walking greater than 25 minutes. She reports no pain today. Her AROM and strength in ankle have improved. She can perform 10 single heelraises on right without pain. Instructed pt in SLS activities and stair traiing. She is able to ascend and descend reciprocal stairs with minimal pain and 1 HR in clinic. LTG #3 achieved. Encouraged pt to try reciprocal stiars with HR as able.    PT Next Visit Plan continue SLS activities, BAPS , gait, stairs, bike, ionto        Problem List Patient Active Problem List   Diagnosis Date Noted  . Numbness 09/20/2014  . Meralgia paraesthetica 09/20/2014  . Ankle fracture, right 09/20/2014  . Nephrolithiasis 08/17/2014    Sherrie Mustache, PTA 10/17/2014, 12:03 PM  [facility] [address] [place], [place], [address] Phone: [phone]   Fax:  [phone]

## 2014-10-18 ENCOUNTER — Encounter: Payer: 59 | Admitting: Neurology

## 2014-10-19 ENCOUNTER — Ambulatory Visit: Payer: 59 | Admitting: Physical Therapy

## 2014-10-24 ENCOUNTER — Ambulatory Visit: Payer: 59 | Admitting: Physical Therapy

## 2014-10-24 DIAGNOSIS — S82891A Other fracture of right lower leg, initial encounter for closed fracture: Secondary | ICD-10-CM

## 2014-10-24 DIAGNOSIS — R609 Edema, unspecified: Secondary | ICD-10-CM

## 2014-10-24 DIAGNOSIS — M25673 Stiffness of unspecified ankle, not elsewhere classified: Secondary | ICD-10-CM

## 2014-10-24 DIAGNOSIS — M25571 Pain in right ankle and joints of right foot: Secondary | ICD-10-CM

## 2014-10-24 DIAGNOSIS — R262 Difficulty in walking, not elsewhere classified: Secondary | ICD-10-CM

## 2014-10-24 NOTE — Therapy (Addendum)
Emily Raymond, [place], [address] Phone: [phone]   Fax:  [phone]  Physical Therapy Treatment  Patient Details  Name: Emily Raymond MRN: [id number] Date of Birth: Apr 09, 1950 Referring Provider:  Cleon Gustin, MD  Encounter Date: 10/24/2014      PT End of Session - 10/24/14 1231    Visit Number 6   Number of Visits 16   Date for PT Re-Evaluation 11/22/14   Authorization Type UHC   Authorization Time Period 11-22-14   PT Start Time 1144   PT Stop Time 1229   PT Time Calculation (min) 45 min   Activity Tolerance Patient tolerated treatment well   Behavior During Therapy [facility] for tasks assessed/performed      Past Medical History  Diagnosis Date  . Hypertension   . Renal disorder   . Complication of anesthesia   . PONV (postoperative nausea and vomiting)   . Kidney stone     currently has one in left kidney; has had 4 other stones   . Urinary tract infection     hx of   . Numbness and tingling     left thigh  . Ankle fracture, right     secondary to fall     Past Surgical History  Procedure Laterality Date  . Abdominal hysterectomy    . Lithotripsy      times 3  . Kidney stone retrieval      with stent placement   . Nephrolithotomy Left 08/17/2014    Procedure: LEFT NEPHROLITHOTOMY PERCUTANEOUS WITH ACCESS;  Surgeon: Cleon Gustin, MD;  Location: [facility];  Service: Urology;  Laterality: Left;    There were no vitals filed for this visit.  Visit Diagnosis:  Ankle fracture, right  Pain in joint, ankle and foot, right  Difficulty walking  Decreased range of motion of ankle  Edema      Subjective Assessment - 10/24/14 1144    Subjective I have been limping a little more this week.  and hurting when I am up on it. Pt numbness in left  thigh reducing but still present.   Pertinent History Pt also had kidney stone surgery Left percutaneous nephrostolithotomy for stone  greater than 2 cm 08-17-14 . Pt with fall down flight of stairs  on 07-26-14. Pt also has numbness of left thigh   Patient Stated Goals Be able to walk normally back to 5 miles a day    Currently in Pain? Yes   Pain Score 5   when walking on it. 0/10 at rest   Pain Location Ankle   Pain Orientation Right   Pain Descriptors / Indicators Tightness  stiffness   Pain Type Acute pain   Pain Onset 1 to 4 weeks ago   Pain Frequency Intermittent            [facility] PT Assessment - 10/24/14 1200    AROM   Right Ankle Dorsiflexion 10   Right Ankle Plantar Flexion 54   Right Ankle Inversion 38   Right Ankle Eversion 25                     [facility] Adult PT Treatment/Exercise - 10/24/14 1149    Modalities   Modalities Iontophoresis   Iontophoresis   Type of Iontophoresis Dexamethasone   Location right anterior ankle    Dose 1 ml for 4- 6 hours  stat patch   Time 6 hours   Manual  Therapy   Joint Mobilization Right ankle mobs, empahsis on talocrural and  EV/INV grade 3    Ankle Exercises: Aerobic   Stationary Bike 5 min level 5 resistance   Ankle Exercises: Standing   SLS 38 seconds   Rebounder red ball 16 toss best without LOB   Heel Raises 10 reps  single and box raises   Toe Raise 10 reps   Tai Chi Facing wall holding on to wall, SLS onright ankle and flexin Left hip and add/abd x 10   Other Standing Ankle Exercises step up with 6 in and hand hold x 2 forward  and side step with Left hip flexion 2 x 10   Other Standing Ankle Exercises SLS with vectors and 2 finger hold to assist with keeping weight shifted to RLE x 10 each way [id number] UE support                PT Education - 10/24/14 1322    Education provided Yes   Education Details Pt educated on mobilization of exercise and need for neuromuscular training.  and pain science regarding left thigh cutaneous pain   Person(s) Educated Patient   Methods Explanation   Comprehension Verbalized understanding           PT Short Term Goals - 10/17/14 1191    PT SHORT TERM GOAL #1   Title "Demonstrate and verbalize understanding of condition management including RICE, positioning, use of A.D., HEP.    Time 4   Period Weeks   Status Achieved   PT SHORT TERM GOAL #2   Title Pt will be independent with initial HEP   Time 4   Period Weeks   Status Achieved   PT SHORT TERM GOAL #3   Title Pt will be able to reduce pain after 25 minutes of standing to 1/10   Time 4   Period Weeks   Status On-going  4/10           PT Long Term Goals - 10/17/14 4782    PT LONG TERM GOAL #1   Title "Pt will be independent with advanced HEP.    Time 8   Period Weeks   Status On-going   PT LONG TERM GOAL #2   Title "Pain will decrease to 0/10 with all functional activities   Time 8   Period Weeks   Status On-going  up to 4/10   PT LONG TERM GOAL #3   Title Pt will be able to negotiate steps without exacerbating pain.   Time 8   Period Weeks   Status Achieved   PT LONG TERM GOAL #4   Title "Pt will tolerate standing and walking for one hour  without increased pain in order to return to PLOF and walking program    Time 8   Period Weeks   Status On-going  4/10 currently   PT LONG TERM GOAL #5   Title LEFS witll improve from 26/80 to at least 16/80   Time 8   Period Weeks   Status On-going               Plan - 10/24/14 1232    Clinical Impression Statement Pt enters clinic with with 5/10 pain and decreased Dorsiflexion.  After manual therapy , DF in R ankle increased to 10 degrees and decreased pain to 3/10.  Pt given inonto patch on anterior R ankle and pt exercised. Pt reports more discomfort with Left thight and numbness. but  states she feels like it is reduceing in area.  Pt  will continue neuromuacular control and strengthening. in SLS.   PT Next Visit Plan continue SLS activities, BAPS , gait, stairs, bike, ionto do Ankle mobs as necessary   PT Home Exercise Plan continue    Consulted and Agree with Plan of Care Patient        Problem List Patient Active Problem List   Diagnosis Date Noted  . Numbness 09/20/2014  . Meralgia paraesthetica 09/20/2014  . Ankle fracture, right 09/20/2014  . Nephrolithiasis 08/17/2014   Emily Raymond, PT 10/24/2014 2:22 PM Phone: [phone] Fax: [facility] [address] [place], [place], [address] Phone: [phone]   Fax:  [phone]

## 2014-10-26 ENCOUNTER — Ambulatory Visit: Payer: 59 | Admitting: Physical Therapy

## 2014-10-26 DIAGNOSIS — S82891A Other fracture of right lower leg, initial encounter for closed fracture: Secondary | ICD-10-CM | POA: Diagnosis not present

## 2014-10-26 DIAGNOSIS — M25673 Stiffness of unspecified ankle, not elsewhere classified: Secondary | ICD-10-CM

## 2014-10-26 DIAGNOSIS — R609 Edema, unspecified: Secondary | ICD-10-CM

## 2014-10-26 DIAGNOSIS — M25571 Pain in right ankle and joints of right foot: Secondary | ICD-10-CM

## 2014-10-26 DIAGNOSIS — R262 Difficulty in walking, not elsewhere classified: Secondary | ICD-10-CM

## 2014-10-26 NOTE — Therapy (Signed)
[facility] Out[facility] Rehabilitation [facility] [address] [place], [place], [address] Phone: [phone]   Fax:  [phone]  Physical Therapy Treatment  Patient Details  Name: Emily Raymond MRN: [id number] Date of Birth: Sep 10, 1950 Referring Provider:  Renford Dills, MD  Encounter Date: 10/26/2014      PT End of Session - 10/26/14 0934    Visit Number 7   Number of Visits 16   Date for PT Re-Evaluation 11/22/14   Authorization Type UHC   Authorization Time Period 11-22-14   PT Start Time 0931   PT Stop Time 1020   PT Time Calculation (min) 49 min      Past Medical History  Diagnosis Date  . Hypertension   . Renal disorder   . Complication of anesthesia   . PONV (postoperative nausea and vomiting)   . Kidney stone     currently has one in left kidney; has had 4 other stones   . Urinary tract infection     hx of   . Numbness and tingling     left thigh  . Ankle fracture, right     secondary to fall     Past Surgical History  Procedure Laterality Date  . Abdominal hysterectomy    . Lithotripsy      times 3  . Kidney stone retrieval      with stent placement   . Nephrolithotomy Left 08/17/2014    Procedure: LEFT NEPHROLITHOTOMY PERCUTANEOUS WITH ACCESS;  Surgeon: Malen Gauze, MD;  Location: [facility];  Service: Urology;  Laterality: Left;    There were no vitals filed for this visit.  Visit Diagnosis:  Ankle fracture, right  Pain in joint, ankle and foot, right  Difficulty walking  Decreased range of motion of ankle  Edema                       [facility] Adult PT Treatment/Exercise - 10/26/14 0001    Manual Therapy   Manual Therapy Edema management   Edema Management Retro massage to decrease edema as well as soft tissue work to soften "snatchy" areas in anterior ankle. Prom following massage is non pain ful.    Joint Mobilization right ankle mobs A/P P/A with PROM all planes   Ankle Exercises: Aerobic   Stationary Bike 5 min level 2    Ankle Exercises: Supine   T-Band 4 way green band seated x 15 each with 5 sec hold                  PT Short Term Goals - 10/17/14 7253    PT SHORT TERM GOAL #1   Title "Demonstrate and verbalize understanding of condition management including RICE, positioning, use of A.D., HEP.    Time 4   Period Weeks   Status Achieved   PT SHORT TERM GOAL #2   Title Pt will be independent with initial HEP   Time 4   Period Weeks   Status Achieved   PT SHORT TERM GOAL #3   Title Pt will be able to reduce pain after 25 minutes of standing to 1/10   Time 4   Period Weeks   Status On-going  4/10           PT Long Term Goals - 10/17/14 6644    PT LONG TERM GOAL #1   Title "Pt will be independent with advanced HEP.    Time 8   Period Weeks  Status On-going   PT LONG TERM GOAL #2   Title "Pain will decrease to 0/10 with all functional activities   Time 8   Period Weeks   Status On-going  up to 4/10   PT LONG TERM GOAL #3   Title Pt will be able to negotiate steps without exacerbating pain.   Time 8   Period Weeks   Status Achieved   PT LONG TERM GOAL #4   Title "Pt will tolerate standing and walking for one hour  without increased pain in order to return to PLOF and walking program    Time 8   Period Weeks   Status On-going  4/10 currently   PT LONG TERM GOAL #5   Title LEFS witll improve from 26/80 to at least 16/80   Time 8   Period Weeks   Status On-going               Plan - 10/26/14 1000    Clinical Impression Statement Pt presents with increased pain after last treatment and 5/10 today. Increased edema. Used edema management techniques including retro massage and vasopneumatic device to decrease edema.  Pt reports decreased pain and edema post treatment. Pt wants to begin walking for exercise. Encouraged her to do so after her pain returns to baseline.    PT Next Visit Plan assess pain and edema after this visit,  continue SLS activities, BAPS , gait, stairs, bike, ionto do Ankle mobs as necessary        Problem List Patient Active Problem List   Diagnosis Date Noted  . Numbness 09/20/2014  . Meralgia paraesthetica 09/20/2014  . Ankle fracture, right 09/20/2014  . Nephrolithiasis 08/17/2014    Sherrie Mustache, PTA 10/26/2014, 10:25 AM  [facility] [address] [place], [place], [address] Phone: [phone]   Fax:  [phone]

## 2014-11-01 ENCOUNTER — Ambulatory Visit: Payer: 59 | Admitting: Neurology

## 2014-11-07 ENCOUNTER — Ambulatory Visit: Payer: 59 | Attending: Urology | Admitting: Physical Therapy

## 2014-11-07 DIAGNOSIS — R29898 Other symptoms and signs involving the musculoskeletal system: Secondary | ICD-10-CM | POA: Insufficient documentation

## 2014-11-07 DIAGNOSIS — M25673 Stiffness of unspecified ankle, not elsewhere classified: Secondary | ICD-10-CM

## 2014-11-07 DIAGNOSIS — R600 Localized edema: Secondary | ICD-10-CM

## 2014-11-07 DIAGNOSIS — R262 Difficulty in walking, not elsewhere classified: Secondary | ICD-10-CM | POA: Diagnosis present

## 2014-11-07 DIAGNOSIS — S82891A Other fracture of right lower leg, initial encounter for closed fracture: Secondary | ICD-10-CM | POA: Diagnosis not present

## 2014-11-07 DIAGNOSIS — M25571 Pain in right ankle and joints of right foot: Secondary | ICD-10-CM | POA: Insufficient documentation

## 2014-11-07 NOTE — Therapy (Signed)
[facility] [address] [place], [place], [address] Phone: [phone]   Fax:  [phone]  Physical Therapy Treatment  Patient Details  Name: Emily Raymond MRN: [id number] Date of Birth: 1950/04/29 Referring Provider:  Malen Gauze, MD  Encounter Date: 11/07/2014      PT End of Session - 11/07/14 1736    Visit Number 8   Date for PT Re-Evaluation 11/22/14   PT Start Time 1330   PT Stop Time 1415   PT Time Calculation (min) 45 min   Activity Tolerance Patient tolerated treatment well   Behavior During Therapy [facility] for tasks assessed/performed      Past Medical History  Diagnosis Date  . Hypertension   . Renal disorder   . Complication of anesthesia   . PONV (postoperative nausea and vomiting)   . Kidney stone     currently has one in left kidney; has had 4 other stones   . Urinary tract infection     hx of   . Numbness and tingling     left thigh  . Ankle fracture, right     secondary to fall     Past Surgical History  Procedure Laterality Date  . Abdominal hysterectomy    . Lithotripsy      times 3  . Kidney stone retrieval      with stent placement   . Nephrolithotomy Left 08/17/2014    Procedure: LEFT NEPHROLITHOTOMY PERCUTANEOUS WITH ACCESS;  Surgeon: Malen Gauze, MD;  Location: [facility];  Service: Urology;  Laterality: Left;    There were no vitals filed for this visit.  Visit Diagnosis:  Ankle fracture, right  Pain in joint, ankle and foot, right  Difficulty walking  Decreased range of motion of ankle  Localized edema      Subjective Assessment - 11/07/14 1344    Subjective Woke up Sat feeling better had a lot of pain last week.  No pain now   Currently in Pain? No/denies                         [facility] Adult PT Treatment/Exercise - 11/07/14 1346    Ankle Exercises: Seated   BAPS --  multiple directions 3 LS   Ankle Exercises: Standing   SLS --  with  plyotoss on floor and with pod 10 X in a row single RT   Heel Raises 10 reps  both, single, and toe lifts  10 x   Side Shuffle (Round Trip) 8 inch step up forward and side.     Braiding (Round Trip) fitter 2 minutes 1 black, 2    Ankle Exercises: Aerobic   Elliptical 4 minutes Ramp 1   Ankle Exercises: Supine   Other Supine Ankle Exercises manual resistance 10 X 3 way   Ankle Exercises: Stretches   Gastroc Stretch 3 reps;30 seconds     All exercises without ankle brace.             PT Short Term Goals - 11/07/14 1739    PT SHORT TERM GOAL #1   Title "Demonstrate and verbalize understanding of condition management including RICE, positioning, use of A.D., HEP.    Time 4   Period Weeks   Status Achieved   PT SHORT TERM GOAL #2   Title Pt will be independent with initial HEP   Time 4   Period Weeks   Status Achieved   PT SHORT TERM  GOAL #3   Title Pt will be able to reduce pain after 25 minutes of standing to 1/10   Time 4   Period Weeks   Status On-going           PT Long Term Goals - 11/07/14 1740    PT LONG TERM GOAL #1   Title "Pt will be independent with advanced HEP.    Time 8   Period Weeks   Status On-going   PT LONG TERM GOAL #2   Title "Pain will decrease to 0/10 with all functional activities   Baseline not yet consistant,  not back to all previous functions.   Time 8   Period Weeks   Status On-going   PT LONG TERM GOAL #3   Title Pt will be able to negotiate steps without exacerbating pain.   Time 8   Period Weeks   Status Achieved   PT LONG TERM GOAL #4   Title "Pt will tolerate standing and walking for one hour  without increased pain in order to return to PLOF and walking program    Time 8   Period Weeks   Status On-going   PT LONG TERM GOAL #5   Title LEFS witll improve from 26/80 to at least 16/80   Time 8   Period Weeks   Status Unable to assess               Plan - 11/07/14 1737    Clinical Impression Statement Pain  much improved.  no pain during session.  Higher level closed chain work tolerated.  No ice needed.   PT Next Visit Plan strengthening ab\nd balance focus .  Try BOSO step ups, lunges static with pole?   PT Home Exercise Plan try walking short distances.   Consulted and Agree with Plan of Care Patient        Problem List Patient Active Problem List   Diagnosis Date Noted  . Numbness 09/20/2014  . Meralgia paraesthetica 09/20/2014  . Ankle fracture, right 09/20/2014  . Nephrolithiasis 08/17/2014    HARRIS,KAREN 11/07/2014, 5:42 PM  [facility] [address] [place], [place], [address] Phone: [phone]   Fax:  [phone]     Liz Beach, PTA 11/07/2014 5:42 PM Phone: [phone] Fax: [phone]

## 2014-11-09 ENCOUNTER — Ambulatory Visit: Payer: 59 | Admitting: Physical Therapy

## 2014-11-09 DIAGNOSIS — M25571 Pain in right ankle and joints of right foot: Secondary | ICD-10-CM

## 2014-11-09 DIAGNOSIS — S82891A Other fracture of right lower leg, initial encounter for closed fracture: Secondary | ICD-10-CM

## 2014-11-09 DIAGNOSIS — M25673 Stiffness of unspecified ankle, not elsewhere classified: Secondary | ICD-10-CM

## 2014-11-09 DIAGNOSIS — R600 Localized edema: Secondary | ICD-10-CM

## 2014-11-09 DIAGNOSIS — R262 Difficulty in walking, not elsewhere classified: Secondary | ICD-10-CM

## 2014-11-09 NOTE — Patient Instructions (Addendum)
  Heel Raise: Unilateral (Standing)   Balance on left foot, then rise on ball of foot. Repeat __10__ times per set. Do __2__ sets per session. Do __2__ sessions per day.  http://orth.exer.us/40   FUNCTIONAL MOBILITY: Toe Walking   Walk forward on toes. _20__ ft per set, __2_ sets per day, __7_ days per week    DORSIFLEXION STRENGTHENING:  Toe Raise (Standing)   Rock back on heels. Repeat __20__ times per set. Do __2__ sets per session. Do __2__ sessions per day.  http://orth.exer.us/42   FUNCTIONAL MOBILITY: Heel Walking   Walk forward on heels. _20__ ft per set, _2__ sets per day, _7__ days per week    SINGLE LIMB STANCE   Stance: single leg on floor. Raise leg. Hold _60__ seconds. Repeat with other leg. _2-3__ reps per set, _2__ sets per day, _7__ days per week  Copyright  VHI. All rights reserved.    Calf Stretch (Soleus)    Gently bend knees slightly and move one foot back. Lean into wall so that stretch is felt in back lower calf. Hold _30___ seconds. Repeat with other leg back. Repeat __3__ times. Do __3__ sessions per day.  http://gt2.exer.us/418   Copyright  VHI. All rights reserved.

## 2014-11-09 NOTE — Therapy (Addendum)
[place], [place], [address] Phone: [phone]   Fax:  [phone]  Physical Therapy Treatment/Discharge Note  Patient Details  Name: Emily Raymond MRN: [id number] Date of Birth: 12-26-1950 Referring Provider:  Seward Carol, MD  Encounter Date: 11/09/2014      PT End of Session - 11/09/14 0939    Visit Number [id number]   Number of Visits 16   Date for PT Re-Evaluation 11/22/14   PT Start Time 0938   PT Stop Time 1011   PT Time Calculation (min) 33 min      Pt attended 9 visits   Past Medical History  Diagnosis Date  . Hypertension   . Renal disorder   . Complication of anesthesia   . PONV (postoperative nausea and vomiting)   . Kidney stone     currently has one in left kidney; has had 4 other stones   . Urinary tract infection     hx of   . Numbness and tingling     left thigh  . Ankle fracture, right     secondary to fall     Past Surgical History  Procedure Laterality Date  . Abdominal hysterectomy    . Lithotripsy      times 3  . Kidney stone retrieval      with stent placement   . Nephrolithotomy Left 08/17/2014    Procedure: LEFT NEPHROLITHOTOMY PERCUTANEOUS WITH ACCESS;  Surgeon: Cleon Gustin, MD;  Location: [facility];  Service: Urology;  Laterality: Left;    There were no vitals filed for this visit.  Visit Diagnosis:  Ankle fracture, right  Pain in joint, ankle and foot, right  Difficulty walking  Decreased range of motion of ankle  Localized edema          [facility] PT Assessment - 11/09/14 0953    Observation/Other Assessments   Lower Extremity Functional Scale  59/80   AROM   Right Ankle Dorsiflexion 10   Right Ankle Plantar Flexion 56   Right Ankle Inversion 40   Right Ankle Eversion 26   Strength   Right/Left Ankle Right   Right Ankle Dorsiflexion 5/5   Right Ankle Plantar Flexion 4/5  25 heel raises-not full ROM   Right Ankle Inversion 5/5   Right  Ankle Eversion 5/5                     [facility] Adult PT Treatment/Exercise - 11/09/14 0946    Ankle Exercises: Aerobic   Elliptical Ramp 3 Level 3 x 5 min   Ankle Exercises: Stretches   Gastroc Stretch 3 reps;30 seconds   Ankle Exercises: Standing   SLS 45 sec   Toe Raise 10 reps   Heel Walk (Round Trip) 82f   Toe Walk (Round Trip) 272f               PT Education - 11/09/14 1008    Education provided Yes   Education Details advanced ankle: toe walking heel walking SLS   Person(s) Educated Patient   Methods Explanation;Handout   Comprehension Verbalized understanding          PT Short Term Goals - 11/09/14 0947    PT SHORT TERM GOAL #1   Title "Demonstrate and verbalize understanding of condition management including RICE, positioning, use of A.D., HEP.    Time 4   Period Weeks   Status Achieved   PT SHORT TERM GOAL #2  Title Pt will be independent with initial HEP   Time 4   Period Weeks   Status Achieved   PT SHORT TERM GOAL #3   Title Pt will be able to reduce pain after 25 minutes of standing to 1/10   Time 4   Period Weeks   Status Achieved           PT Long Term Goals - 11/09/14 1015    PT LONG TERM GOAL #1   Title "Pt will be independent with advanced HEP.    Time 8   Period Weeks   Status Achieved   PT LONG TERM GOAL #2   Title "Pain will decrease to 0/10 with all functional activities   Time 8   Period Weeks   Status Achieved   PT LONG TERM GOAL #3   Title Pt will be able to negotiate steps without exacerbating pain.   Time 8   Period Weeks   Status Achieved   PT LONG TERM GOAL #4   Title "Pt will tolerate standing and walking for one hour  without increased pain in order to return to PLOF and walking program    Time 8   Period Weeks   Status Achieved   PT LONG TERM GOAL #5   Title LEFS witll improve from 26/80 to at least 16/80   Baseline improved to 59/80=26% limited improved from 67 % limited   Time 8   Period  Weeks   Status Achieved               Plan - 11/09/14 0940    Clinical Impression Statement Pt reports she worked out in the yard for 3 hours yesterday, with her brace on, and she did not have ankle pain. All Goals met. Pt ready for discharge. Independent with andvanced HEP.   PT Next Visit Plan discharge today        Problem List Patient Active Problem List   Diagnosis Date Noted  . Numbness 09/20/2014  . Meralgia paraesthetica 09/20/2014  . Ankle fracture, right 09/20/2014  . Nephrolithiasis 08/17/2014    Dorene Ar, PTA 11/09/2014, 10:19 AM  [facility] [address] [place], [place], [address] Phone: [phone]   Fax:  [phone]  PHYSICAL THERAPY DISCHARGE SUMMARY  Visits from Start of Care: 9  Current functional level related to goals / functional outcomes: See goals above   Remaining deficits: None  Able to work in the yard for 3 hours   Education / Equipment: HEP Plan: Patient agrees to discharge.  Patient goals were met. Patient is being discharged due to meeting the stated rehab goals.  ????? and pt pleased with current functional level        Voncille Lo, PT 11/28/2014 2:00 PM Phone: [phone] Fax: [phone]

## 2018-08-26 ENCOUNTER — Other Ambulatory Visit: Payer: Self-pay

## 2018-08-26 ENCOUNTER — Ambulatory Visit: Payer: Self-pay

## 2018-08-26 ENCOUNTER — Encounter: Payer: Self-pay | Admitting: Orthopaedic Surgery

## 2018-08-26 ENCOUNTER — Ambulatory Visit (INDEPENDENT_AMBULATORY_CARE_PROVIDER_SITE_OTHER): Payer: Medicare Other | Admitting: Orthopaedic Surgery

## 2018-08-26 DIAGNOSIS — M7052 Other bursitis of knee, left knee: Secondary | ICD-10-CM

## 2018-08-26 DIAGNOSIS — M25562 Pain in left knee: Secondary | ICD-10-CM | POA: Diagnosis not present

## 2018-08-26 MED ORDER — LIDOCAINE HCL 1 % IJ SOLN
1.0000 mL | INTRAMUSCULAR | Status: AC | PRN
  Administered 2018-08-26: 1 mL

## 2018-08-26 MED ORDER — METHYLPREDNISOLONE ACETATE 40 MG/ML IJ SUSP
40.0000 mg | INTRAMUSCULAR | Status: AC | PRN
  Administered 2018-08-26: 40 mg via INTRA_ARTICULAR

## 2018-08-26 MED ORDER — LIDOCAINE HCL 1 % IJ SOLN
3.0000 mL | INTRAMUSCULAR | Status: AC | PRN
  Administered 2018-08-26: 3 mL

## 2018-08-26 MED ORDER — METHYLPREDNISOLONE ACETATE 40 MG/ML IJ SUSP
40.0000 mg | INTRAMUSCULAR | Status: AC | PRN
  Administered 2018-08-26: 40 mg via INTRAMUSCULAR

## 2018-08-26 NOTE — Progress Notes (Signed)
Office Visit Note   Patient: Emily Raymond           Date of Birth: [birth year]           MRN: [id number] Visit Date: 08/26/2018              Requested by: Emily Carol, MD [address] [place],  [place] [address] PCP: Emily Carol, MD   Assessment & Plan: Visit Diagnoses:  1. Acute pain of left knee   2. Pes anserinus bursitis of left knee     Plan: Discussed with patient knee friendly exercises.  Also showed her quad strengthening exercises that she can do on her own.  Have her follow-up with [place] in 2 weeks check her progress lack of.  Questions encouraged and answered at length  Follow-Up Instructions: Return in about 2 weeks (around 09/09/2018).   Orders:  Orders Placed This Encounter  Procedures  . Large Joint Inj: L knee  . Trigger Point Inj  . XR Knee 1-2 Views Left   No orders of the defined types were placed in this encounter.     Procedures: Large Joint Inj: L knee on 08/26/2018 9:21 AM Indications: pain Details: 22 G 1.5 in needle, superolateral approach  Arthrogram: No  Medications: 3 mL lidocaine 1 %; 40 mg methylPREDNISolone acetate 40 MG/ML Outcome: tolerated well, no immediate complications Procedure, treatment alternatives, risks and benefits explained, specific risks discussed. Consent was given by the patient. Immediately prior to procedure a time out was called to verify the correct patient, procedure, equipment, support staff and site/side marked as required. Patient was prepped and draped in the usual sterile fashion.   Trigger Point Inj  Date/Time: 08/26/2018 9:22 AM Performed by: Pete Pelt, PA-C Authorized by: Pete Pelt, PA-C   Consent Given by:  Patient Site marked: the procedure site was marked   Indications:  Pain Total # of Trigger Points:  1 Location: lower extremity   Approach:  Medial Medications #1:  1 mL lidocaine 1 %; 40 mg methylPREDNISolone acetate 40 MG/ML     Clinical Data: No additional  findings.   Subjective: Chief Complaint  Patient presents with  . Left Knee - Follow-up    HPI Emily Raymond is a 68 year old female comes in today with left knee pain.  Pain began last Monday.  No known injury.  She has been doing a lot of exercise walking several miles a day also doing steps for exercise.  She states initially she was unable to walk on the knee.  She is having some swelling about the knee but no mechanical symptoms.  She is been taking Aleve 2 tablets twice daily without significant relief.  Review of Systems  Constitutional: Negative for chills and fever.  Respiratory: Negative for shortness of breath.   Cardiovascular: Negative for chest pain.     Objective: Vital Signs: There were no vitals taken for this visit.  Physical Exam General: Well-developed well-nourished female no acute distress mood affect appropriate Psych alert and oriented x3. Ortho Exam Bilateral knees full range of motion.  Tenderness over the left knee medial joint line and pes anserinus region.  Significant patellofemoral crepitus left knee.  No instability valgus varus stressing.  No abnormal warmth erythema.  Left knee with slight swelling plus minus effusion.  Specialty Comments:  No specialty comments available.  Imaging: Xr Knee 1-2 Views Left  Result Date: 08/26/2018 Left knee AP lateral views: No acute fractures.  Medial lateral  joint lines well-maintained slight periarticular spurs medial lateral joint line.  Severe patellofemoral changes.    PMFS History: Patient Active Problem List   Diagnosis Date Noted  . Numbness 09/20/2014  . Meralgia paraesthetica 09/20/2014  . Ankle fracture, right 09/20/2014  . Nephrolithiasis 08/17/2014   Past Medical History:  Diagnosis Date  . Ankle fracture, right    secondary to fall   . Complication of anesthesia   . Hypertension   . Kidney stone    currently has one in left kidney; has had 4 other stones   . Numbness and tingling     left thigh  . PONV (postoperative nausea and vomiting)   . Renal disorder   . Urinary tract infection    hx of     Family History  Problem Relation Age of Onset  . Dementia Mother   . Stroke Father   . Hypertension Father     Past Surgical History:  Procedure Laterality Date  . ABDOMINAL HYSTERECTOMY    . kidney stone retrieval     with stent placement   . LITHOTRIPSY     times 3  . NEPHROLITHOTOMY Left 08/17/2014   Procedure: LEFT NEPHROLITHOTOMY PERCUTANEOUS WITH ACCESS;  Surgeon: Malen GauzePatrick L McKenzie, MD;  Location: [facility];  Service: Urology;  Laterality: Left;   Social History   Occupational History  . Not on file  Tobacco Use  . Smoking status: Never Smoker  . Smokeless tobacco: Never Used  Substance and Sexual Activity  . Alcohol use: No  . Drug use: No  . Sexual activity: Not on file

## 2018-09-14 ENCOUNTER — Ambulatory Visit: Payer: Medicare Other | Admitting: Physician Assistant

## 2019-02-19 ENCOUNTER — Ambulatory Visit: Payer: Medicare Other | Attending: Internal Medicine

## 2019-02-19 DIAGNOSIS — Z23 Encounter for immunization: Secondary | ICD-10-CM | POA: Insufficient documentation

## 2019-02-22 NOTE — Progress Notes (Signed)
   Covid-19 Vaccination Clinic  Name:  SANYA KOBRIN    MRN: [id number] DOB: 05/22/1950  02/19/2019  Ms. Wedemeyer was observed post Covid-19 immunization for 15 minutes without incidence. She was provided with Vaccine Information Sheet and instruction to access the V-Safe system.   Ms. Pryer was instructed to call 911 with any severe reactions post vaccine: Marland Kitchen Difficulty breathing  . Swelling of your face and throat  . A fast heartbeat  . A bad rash all over your body  . Dizziness and weakness    Immunizations Administered    Name Date Dose VIS Date Route   Moderna COVID-19 Vaccine 02/19/2019 10:01 AM 0.5 mL 12/29/2018 Intramuscular   Manufacturer: Moderna   Lot: [id number]   NDC: 84132-440-10

## 2019-03-19 ENCOUNTER — Ambulatory Visit: Payer: Medicare Other

## 2019-03-26 ENCOUNTER — Ambulatory Visit: Payer: Medicare Other | Attending: Internal Medicine

## 2019-03-26 DIAGNOSIS — Z23 Encounter for immunization: Secondary | ICD-10-CM | POA: Insufficient documentation

## 2019-03-26 NOTE — Progress Notes (Signed)
   Covid-19 Vaccination Clinic  Name:  Emily Raymond    MRN: [id number] DOB: January 14, 1951  03/26/2019  Emily Raymond was observed post Covid-19 immunization for 15 minutes without incidence. She was provided with Vaccine Information Sheet and instruction to access the V-Safe system.   Emily Raymond was instructed to call 911 with any severe reactions post vaccine: Marland Kitchen Difficulty breathing  . Swelling of your face and throat  . A fast heartbeat  . A bad rash all over your body  . Dizziness and weakness    Immunizations Administered    Name Date Dose VIS Date Route   Moderna COVID-19 Vaccine 03/26/2019  9:45 AM 0.5 mL 12/29/2018 Intramuscular   Manufacturer: Moderna   Lot: [id number]   NDC: 98721-587-27

## 2019-07-21 ENCOUNTER — Other Ambulatory Visit: Payer: Self-pay | Admitting: Internal Medicine

## 2019-07-21 DIAGNOSIS — M85859 Other specified disorders of bone density and structure, unspecified thigh: Secondary | ICD-10-CM

## 2019-07-28 ENCOUNTER — Ambulatory Visit
Admission: RE | Admit: 2019-07-28 | Discharge: 2019-07-28 | Disposition: A | Discharge status: Home / Self Care | Payer: Medicare Other | Source: Ambulatory Visit | Attending: Internal Medicine | Admitting: Internal Medicine

## 2019-07-28 ENCOUNTER — Other Ambulatory Visit: Payer: Self-pay

## 2019-07-28 DIAGNOSIS — M85859 Other specified disorders of bone density and structure, unspecified thigh: Secondary | ICD-10-CM

## 2019-10-02 ENCOUNTER — Emergency Department (HOSPITAL_COMMUNITY): Payer: Medicare Other

## 2019-10-02 ENCOUNTER — Encounter (HOSPITAL_COMMUNITY): Payer: Self-pay

## 2019-10-02 ENCOUNTER — Other Ambulatory Visit: Payer: Self-pay

## 2019-10-02 ENCOUNTER — Emergency Department (HOSPITAL_COMMUNITY)
Admission: EM | Admit: 2019-10-02 | Discharge: 2019-10-02 | Disposition: A | Discharge status: Home / Self Care | Payer: Medicare Other | Attending: Emergency Medicine | Admitting: Emergency Medicine

## 2019-10-02 DIAGNOSIS — Z7982 Long term (current) use of aspirin: Secondary | ICD-10-CM | POA: Insufficient documentation

## 2019-10-02 DIAGNOSIS — I10 Essential (primary) hypertension: Secondary | ICD-10-CM | POA: Insufficient documentation

## 2019-10-02 DIAGNOSIS — Y999 Unspecified external cause status: Secondary | ICD-10-CM | POA: Insufficient documentation

## 2019-10-02 DIAGNOSIS — Y939 Activity, unspecified: Secondary | ICD-10-CM | POA: Diagnosis not present

## 2019-10-02 DIAGNOSIS — S199XXA Unspecified injury of neck, initial encounter: Secondary | ICD-10-CM | POA: Diagnosis present

## 2019-10-02 DIAGNOSIS — S161XXA Strain of muscle, fascia and tendon at neck level, initial encounter: Secondary | ICD-10-CM | POA: Diagnosis not present

## 2019-10-02 DIAGNOSIS — S20212A Contusion of left front wall of thorax, initial encounter: Secondary | ICD-10-CM

## 2019-10-02 DIAGNOSIS — Z79899 Other long term (current) drug therapy: Secondary | ICD-10-CM | POA: Diagnosis not present

## 2019-10-02 DIAGNOSIS — M25511 Pain in right shoulder: Secondary | ICD-10-CM | POA: Insufficient documentation

## 2019-10-02 DIAGNOSIS — Y929 Unspecified place or not applicable: Secondary | ICD-10-CM | POA: Insufficient documentation

## 2019-10-02 DIAGNOSIS — S20219A Contusion of unspecified front wall of thorax, initial encounter: Secondary | ICD-10-CM | POA: Insufficient documentation

## 2019-10-02 MED ORDER — ACETAMINOPHEN 325 MG PO TABS
650.0000 mg | ORAL_TABLET | Freq: Once | ORAL | Status: AC
  Administered 2019-10-02: 650 mg via ORAL
  Filled 2019-10-02: qty 2

## 2019-10-02 NOTE — ED Notes (Signed)
DC instructions reviewed with pt.  Pt verbalized understanding.  Pt is  DC.

## 2019-10-02 NOTE — ED Notes (Signed)
Pt husband Mathis Fare wants a call when pt goes back to a room.
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
## 2019-11-23 ENCOUNTER — Ambulatory Visit: Payer: Medicare Other | Attending: Internal Medicine

## 2019-11-23 DIAGNOSIS — Z23 Encounter for immunization: Secondary | ICD-10-CM

## 2019-11-23 NOTE — Progress Notes (Signed)
   Covid-19 Vaccination Clinic  Name:  Emily Raymond    MRN: [id number] DOB: 17-Jun-1950  11/23/2019  [name] was observed post Covid-19 immunization for 15 minutes without incident. She was provided with Vaccine Information Sheet and instruction to access the V-Safe system.   [name] was instructed to call 911 with any severe reactions post vaccine: Marland Kitchen Difficulty breathing  . Swelling of face and throat  . A fast heartbeat  . A bad rash all over body  . Dizziness and weakness

## 2020-08-30 ENCOUNTER — Ambulatory Visit: Payer: Medicare Other | Admitting: Neurology

## 2020-10-17 ENCOUNTER — Ambulatory Visit: Payer: Medicare Other | Admitting: Neurology

## 2020-11-14 ENCOUNTER — Ambulatory Visit: Payer: Medicare Other | Admitting: Neurology
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
## 2023-03-07 NOTE — Progress Notes (Signed)
 Patient advised to contact PCP about BP.

## 2023-04-16 NOTE — Progress Notes (Signed)
 Patient attended screening event on 03/07/2023. At the screening event pt B/P was 138/82. At the event pt documented her PCP as Dr. Merry Proud at [facility], pt documented Medicare for insurance. Pt documented no to tobacco [place], pt does not live with someone who smokes. Pt did not document SDOH needs at the event. At the event pt was instructed to lower B/P and contact PCP about B/P by clinician.   Per Chart review pt PCP shows as Lorenda Ishihara, MD. Chart review confirms pt insurance is Medicare w/[employer]. Pt last office visit not visible in [facility]. Chart review indicates pt has Food insecurity from screening event. Food insecurity resources were provided to pt at screening event.   Post event initial f/u [facility] called pt PCP office at [facility] that confirmed pt last office visit with PCP was on 01/20/2023, no future appt has been scheduled by pt yet.  Attempted and unable to contact pt via phone. Multiple vm were left on the pt mobile number. Abnormal B/P letter sent with resource information about the risks of high BP, Pocket size BP log,  211 card with food resource information in case needed by pt. Additional pt f/u to be scheduled per health equity protocol.

## 2023-06-06 ENCOUNTER — Encounter: Payer: Self-pay | Admitting: *Deleted
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
## 2023-07-11 ENCOUNTER — Other Ambulatory Visit: Payer: Self-pay | Admitting: Nurse Practitioner

## 2023-07-11 ENCOUNTER — Ambulatory Visit
Admission: RE | Admit: 2023-07-11 | Discharge: 2023-07-11 | Disposition: A | Discharge status: Home / Self Care | Source: Ambulatory Visit | Attending: Nurse Practitioner | Admitting: Nurse Practitioner

## 2023-07-11 DIAGNOSIS — M79641 Pain in right hand: Secondary | ICD-10-CM

## 2023-09-30 ENCOUNTER — Encounter: Payer: Self-pay | Admitting: *Deleted
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
